# Patient Record
Sex: Male | Born: 1940 | Race: White | Hispanic: No | Smoking: Former smoker
Health system: Southern US, Community
[De-identification: ages and names within clinical notes are randomized; demographics above are authoritative.]

## PROBLEM LIST (undated history)

## (undated) DIAGNOSIS — N400 Enlarged prostate without lower urinary tract symptoms: Secondary | ICD-10-CM

## (undated) DIAGNOSIS — C3490 Malignant neoplasm of unspecified part of unspecified bronchus or lung: Secondary | ICD-10-CM

## (undated) DIAGNOSIS — F0280 Dementia in other diseases classified elsewhere without behavioral disturbance: Secondary | ICD-10-CM

## (undated) DIAGNOSIS — I10 Essential (primary) hypertension: Secondary | ICD-10-CM

## (undated) DIAGNOSIS — I714 Abdominal aortic aneurysm, without rupture, unspecified: Secondary | ICD-10-CM

## (undated) DIAGNOSIS — F39 Unspecified mood [affective] disorder: Secondary | ICD-10-CM

## (undated) DIAGNOSIS — K219 Gastro-esophageal reflux disease without esophagitis: Secondary | ICD-10-CM

## (undated) DIAGNOSIS — F32A Depression, unspecified: Secondary | ICD-10-CM

## (undated) DIAGNOSIS — G309 Alzheimer's disease, unspecified: Secondary | ICD-10-CM

## (undated) DIAGNOSIS — I639 Cerebral infarction, unspecified: Secondary | ICD-10-CM

## (undated) DIAGNOSIS — F22 Delusional disorders: Secondary | ICD-10-CM

## (undated) DIAGNOSIS — J40 Bronchitis, not specified as acute or chronic: Secondary | ICD-10-CM

## (undated) DIAGNOSIS — E785 Hyperlipidemia, unspecified: Secondary | ICD-10-CM

## (undated) DIAGNOSIS — N19 Unspecified kidney failure: Secondary | ICD-10-CM

## (undated) DIAGNOSIS — J449 Chronic obstructive pulmonary disease, unspecified: Secondary | ICD-10-CM

## (undated) DIAGNOSIS — F329 Major depressive disorder, single episode, unspecified: Secondary | ICD-10-CM

## (undated) DIAGNOSIS — I2510 Atherosclerotic heart disease of native coronary artery without angina pectoris: Secondary | ICD-10-CM

## (undated) DIAGNOSIS — I509 Heart failure, unspecified: Secondary | ICD-10-CM

## (undated) DIAGNOSIS — R262 Difficulty in walking, not elsewhere classified: Secondary | ICD-10-CM

## (undated) DIAGNOSIS — F0151 Vascular dementia with behavioral disturbance: Secondary | ICD-10-CM

## (undated) DIAGNOSIS — F0152 Vascular dementia, unspecified severity, with psychotic disturbance: Secondary | ICD-10-CM

## (undated) DIAGNOSIS — M6281 Muscle weakness (generalized): Secondary | ICD-10-CM

## (undated) HISTORY — DX: Essential (primary) hypertension: I10

## (undated) HISTORY — DX: Benign prostatic hyperplasia without lower urinary tract symptoms: N40.0

## (undated) HISTORY — DX: Major depressive disorder, single episode, unspecified: F32.9

## (undated) HISTORY — PX: HERNIA REPAIR: SHX51

## (undated) HISTORY — DX: Unspecified kidney failure: N19

## (undated) HISTORY — PX: CHOLECYSTECTOMY: SHX55

## (undated) HISTORY — DX: Abdominal aortic aneurysm, without rupture: I71.4

## (undated) HISTORY — DX: Chronic obstructive pulmonary disease, unspecified: J44.9

## (undated) HISTORY — DX: Hyperlipidemia, unspecified: E78.5

## (undated) HISTORY — DX: Difficulty in walking, not elsewhere classified: R26.2

## (undated) HISTORY — DX: Abdominal aortic aneurysm, without rupture, unspecified: I71.40

## (undated) HISTORY — DX: Malignant neoplasm of unspecified part of unspecified bronchus or lung: C34.90

## (undated) HISTORY — DX: Unspecified mood (affective) disorder: F39

## (undated) HISTORY — DX: Heart failure, unspecified: I50.9

## (undated) HISTORY — PX: KNEE SURGERY: SHX244

## (undated) HISTORY — DX: Vascular dementia, unspecified severity, with psychotic disturbance: F01.52

## (undated) HISTORY — DX: Gastro-esophageal reflux disease without esophagitis: K21.9

## (undated) HISTORY — DX: Atherosclerotic heart disease of native coronary artery without angina pectoris: I25.10

## (undated) HISTORY — DX: Cerebral infarction, unspecified: I63.9

## (undated) HISTORY — DX: Depression, unspecified: F32.A

## (undated) HISTORY — DX: Bronchitis, not specified as acute or chronic: J40

## (undated) HISTORY — DX: Delusional disorders: F22

## (undated) HISTORY — DX: Muscle weakness (generalized): M62.81

## (undated) HISTORY — DX: Delusional disorders: F01.51

---

## 1996-01-20 ENCOUNTER — Encounter: Payer: Self-pay | Admitting: Internal Medicine

## 1996-01-20 HISTORY — PX: CORONARY ANGIOPLASTY WITH STENT PLACEMENT: SHX49

## 1996-06-28 ENCOUNTER — Encounter: Payer: Self-pay | Admitting: Cardiovascular Disease

## 1997-09-23 DIAGNOSIS — I639 Cerebral infarction, unspecified: Secondary | ICD-10-CM

## 1997-09-23 HISTORY — PX: CORONARY ARTERY BYPASS GRAFT: SHX141

## 1997-09-23 HISTORY — PX: OTHER SURGICAL HISTORY: SHX169

## 1997-09-23 HISTORY — DX: Cerebral infarction, unspecified: I63.9

## 1997-12-15 ENCOUNTER — Encounter: Payer: Self-pay | Admitting: Internal Medicine

## 1997-12-26 ENCOUNTER — Inpatient Hospital Stay (HOSPITAL_COMMUNITY): Admission: EM | Admit: 1997-12-26 | Discharge: 1997-12-31 | Payer: Self-pay | Admitting: Emergency Medicine

## 1997-12-26 ENCOUNTER — Encounter: Payer: Self-pay | Admitting: Internal Medicine

## 1997-12-31 ENCOUNTER — Inpatient Hospital Stay (HOSPITAL_COMMUNITY)
Admission: AD | Admit: 1997-12-31 | Discharge: 1998-01-20 | Payer: Self-pay | Admitting: Physical Medicine & Rehabilitation

## 1998-02-21 ENCOUNTER — Emergency Department (HOSPITAL_COMMUNITY): Admission: EM | Admit: 1998-02-21 | Discharge: 1998-02-21 | Payer: Self-pay | Admitting: Emergency Medicine

## 2000-12-06 ENCOUNTER — Emergency Department (HOSPITAL_COMMUNITY): Admission: EM | Admit: 2000-12-06 | Discharge: 2000-12-06 | Payer: Self-pay | Admitting: Emergency Medicine

## 2000-12-06 ENCOUNTER — Encounter: Payer: Self-pay | Admitting: Emergency Medicine

## 2002-06-01 ENCOUNTER — Encounter: Admission: RE | Admit: 2002-06-01 | Discharge: 2002-06-01 | Payer: Self-pay | Admitting: Emergency Medicine

## 2002-06-01 ENCOUNTER — Encounter: Payer: Self-pay | Admitting: Emergency Medicine

## 2002-06-19 ENCOUNTER — Encounter: Payer: Self-pay | Admitting: *Deleted

## 2002-06-19 ENCOUNTER — Encounter: Payer: Self-pay | Admitting: Emergency Medicine

## 2002-06-19 ENCOUNTER — Inpatient Hospital Stay (HOSPITAL_COMMUNITY): Admission: EM | Admit: 2002-06-19 | Discharge: 2002-06-24 | Payer: Self-pay | Admitting: *Deleted

## 2002-06-21 ENCOUNTER — Encounter: Payer: Self-pay | Admitting: Emergency Medicine

## 2002-06-22 ENCOUNTER — Encounter (INDEPENDENT_AMBULATORY_CARE_PROVIDER_SITE_OTHER): Payer: Self-pay | Admitting: Specialist

## 2005-05-31 ENCOUNTER — Encounter: Payer: Self-pay | Admitting: Internal Medicine

## 2005-05-31 ENCOUNTER — Ambulatory Visit: Payer: Self-pay | Admitting: Internal Medicine

## 2005-05-31 ENCOUNTER — Observation Stay (HOSPITAL_COMMUNITY): Admission: EM | Admit: 2005-05-31 | Discharge: 2005-05-31 | Payer: Self-pay | Admitting: Emergency Medicine

## 2005-06-19 ENCOUNTER — Ambulatory Visit (HOSPITAL_COMMUNITY): Admission: RE | Admit: 2005-06-19 | Discharge: 2005-06-19 | Payer: Self-pay | Admitting: Emergency Medicine

## 2005-06-19 ENCOUNTER — Encounter (INDEPENDENT_AMBULATORY_CARE_PROVIDER_SITE_OTHER): Payer: Self-pay | Admitting: Specialist

## 2005-06-25 ENCOUNTER — Ambulatory Visit: Payer: Self-pay | Admitting: Internal Medicine

## 2005-06-28 ENCOUNTER — Ambulatory Visit (HOSPITAL_COMMUNITY): Admission: RE | Admit: 2005-06-28 | Discharge: 2005-06-28 | Payer: Self-pay | Admitting: Internal Medicine

## 2005-07-08 ENCOUNTER — Ambulatory Visit: Payer: Self-pay | Admitting: Internal Medicine

## 2005-07-25 ENCOUNTER — Encounter (INDEPENDENT_AMBULATORY_CARE_PROVIDER_SITE_OTHER): Payer: Self-pay | Admitting: Specialist

## 2005-07-25 ENCOUNTER — Inpatient Hospital Stay (HOSPITAL_COMMUNITY)
Admission: RE | Admit: 2005-07-25 | Discharge: 2005-08-08 | Payer: Self-pay | Admitting: Thoracic Surgery (Cardiothoracic Vascular Surgery)

## 2005-09-23 HISTORY — PX: OTHER SURGICAL HISTORY: SHX169

## 2005-09-24 ENCOUNTER — Encounter
Admission: RE | Admit: 2005-09-24 | Discharge: 2005-09-24 | Payer: Self-pay | Admitting: Thoracic Surgery (Cardiothoracic Vascular Surgery)

## 2005-12-30 ENCOUNTER — Encounter
Admission: RE | Admit: 2005-12-30 | Discharge: 2005-12-30 | Payer: Self-pay | Admitting: Thoracic Surgery (Cardiothoracic Vascular Surgery)

## 2006-04-18 ENCOUNTER — Encounter
Admission: RE | Admit: 2006-04-18 | Discharge: 2006-04-18 | Payer: Self-pay | Admitting: Thoracic Surgery (Cardiothoracic Vascular Surgery)

## 2006-07-22 ENCOUNTER — Encounter
Admission: RE | Admit: 2006-07-22 | Discharge: 2006-07-22 | Payer: Self-pay | Admitting: Thoracic Surgery (Cardiothoracic Vascular Surgery)

## 2006-09-23 DIAGNOSIS — C3490 Malignant neoplasm of unspecified part of unspecified bronchus or lung: Secondary | ICD-10-CM

## 2006-09-23 HISTORY — DX: Malignant neoplasm of unspecified part of unspecified bronchus or lung: C34.90

## 2007-05-27 ENCOUNTER — Emergency Department (HOSPITAL_COMMUNITY): Admission: EM | Admit: 2007-05-27 | Discharge: 2007-05-27 | Payer: Self-pay | Admitting: *Deleted

## 2008-07-05 ENCOUNTER — Emergency Department (HOSPITAL_COMMUNITY): Admission: EM | Admit: 2008-07-05 | Discharge: 2008-07-05 | Payer: Self-pay | Admitting: Emergency Medicine

## 2008-10-05 ENCOUNTER — Inpatient Hospital Stay (HOSPITAL_COMMUNITY): Admission: EM | Admit: 2008-10-05 | Discharge: 2008-10-07 | Payer: Self-pay | Admitting: Emergency Medicine

## 2008-10-05 ENCOUNTER — Ambulatory Visit: Payer: Self-pay | Admitting: Cardiology

## 2008-10-06 ENCOUNTER — Encounter (INDEPENDENT_AMBULATORY_CARE_PROVIDER_SITE_OTHER): Payer: Self-pay | Admitting: Internal Medicine

## 2008-10-06 ENCOUNTER — Ambulatory Visit: Payer: Self-pay | Admitting: Surgery

## 2008-10-06 ENCOUNTER — Encounter: Payer: Self-pay | Admitting: Cardiovascular Disease

## 2008-12-16 ENCOUNTER — Encounter: Payer: Self-pay | Admitting: Internal Medicine

## 2008-12-16 DIAGNOSIS — Z8679 Personal history of other diseases of the circulatory system: Secondary | ICD-10-CM | POA: Insufficient documentation

## 2008-12-16 DIAGNOSIS — I10 Essential (primary) hypertension: Secondary | ICD-10-CM | POA: Insufficient documentation

## 2008-12-16 DIAGNOSIS — R269 Unspecified abnormalities of gait and mobility: Secondary | ICD-10-CM

## 2008-12-16 DIAGNOSIS — I2510 Atherosclerotic heart disease of native coronary artery without angina pectoris: Secondary | ICD-10-CM

## 2008-12-16 DIAGNOSIS — K219 Gastro-esophageal reflux disease without esophagitis: Secondary | ICD-10-CM

## 2008-12-16 DIAGNOSIS — J40 Bronchitis, not specified as acute or chronic: Secondary | ICD-10-CM | POA: Insufficient documentation

## 2009-01-16 ENCOUNTER — Emergency Department (HOSPITAL_COMMUNITY): Admission: EM | Admit: 2009-01-16 | Discharge: 2009-01-16 | Payer: Self-pay | Admitting: Emergency Medicine

## 2009-01-16 ENCOUNTER — Encounter (INDEPENDENT_AMBULATORY_CARE_PROVIDER_SITE_OTHER): Payer: Self-pay | Admitting: Emergency Medicine

## 2009-01-16 ENCOUNTER — Ambulatory Visit: Payer: Self-pay | Admitting: Vascular Surgery

## 2009-02-14 ENCOUNTER — Ambulatory Visit: Payer: Self-pay | Admitting: Internal Medicine

## 2009-02-14 DIAGNOSIS — E785 Hyperlipidemia, unspecified: Secondary | ICD-10-CM

## 2009-02-14 DIAGNOSIS — F39 Unspecified mood [affective] disorder: Secondary | ICD-10-CM | POA: Insufficient documentation

## 2009-02-14 DIAGNOSIS — Z8669 Personal history of other diseases of the nervous system and sense organs: Secondary | ICD-10-CM

## 2009-02-15 DIAGNOSIS — I5032 Chronic diastolic (congestive) heart failure: Secondary | ICD-10-CM

## 2009-02-28 ENCOUNTER — Ambulatory Visit: Payer: Self-pay | Admitting: Internal Medicine

## 2009-03-21 DIAGNOSIS — I219 Acute myocardial infarction, unspecified: Secondary | ICD-10-CM | POA: Insufficient documentation

## 2009-03-21 DIAGNOSIS — C3490 Malignant neoplasm of unspecified part of unspecified bronchus or lung: Secondary | ICD-10-CM | POA: Insufficient documentation

## 2009-03-21 DIAGNOSIS — R0602 Shortness of breath: Secondary | ICD-10-CM

## 2009-03-21 DIAGNOSIS — I6350 Cerebral infarction due to unspecified occlusion or stenosis of unspecified cerebral artery: Secondary | ICD-10-CM | POA: Insufficient documentation

## 2009-03-22 ENCOUNTER — Ambulatory Visit: Payer: Self-pay | Admitting: Cardiology

## 2009-03-23 ENCOUNTER — Telehealth: Payer: Self-pay | Admitting: Cardiology

## 2009-04-03 ENCOUNTER — Telehealth (INDEPENDENT_AMBULATORY_CARE_PROVIDER_SITE_OTHER): Payer: Self-pay | Admitting: *Deleted

## 2009-04-04 ENCOUNTER — Encounter: Payer: Self-pay | Admitting: Cardiology

## 2009-04-04 ENCOUNTER — Ambulatory Visit: Payer: Self-pay

## 2009-04-04 ENCOUNTER — Encounter: Payer: Self-pay | Admitting: Cardiovascular Disease

## 2009-05-02 ENCOUNTER — Ambulatory Visit: Payer: Self-pay | Admitting: Internal Medicine

## 2009-05-02 LAB — CONVERTED CEMR LAB
Glucose, Urine, Semiquant: NEGATIVE
Ketones, urine, test strip: NEGATIVE
Protein, U semiquant: NEGATIVE
Specific Gravity, Urine: 1.01
Urobilinogen, UA: 0.2
WBC Urine, dipstick: NEGATIVE
pH: 5

## 2009-05-11 ENCOUNTER — Ambulatory Visit: Payer: Self-pay | Admitting: Cardiology

## 2009-05-11 DIAGNOSIS — I714 Abdominal aortic aneurysm, without rupture, unspecified: Secondary | ICD-10-CM | POA: Insufficient documentation

## 2009-05-25 ENCOUNTER — Encounter: Payer: Self-pay | Admitting: Cardiology

## 2009-05-25 ENCOUNTER — Ambulatory Visit: Payer: Self-pay

## 2009-07-15 ENCOUNTER — Emergency Department (HOSPITAL_COMMUNITY): Admission: EM | Admit: 2009-07-15 | Discharge: 2009-07-15 | Payer: Self-pay | Admitting: Emergency Medicine

## 2009-07-15 ENCOUNTER — Emergency Department (HOSPITAL_COMMUNITY): Admission: EM | Admit: 2009-07-15 | Discharge: 2009-07-15 | Payer: Self-pay | Admitting: Family Medicine

## 2009-07-15 LAB — CONVERTED CEMR LAB
HCT: 38.6 %
Hemoglobin: 13 g/dL
Monocytes Relative: 11 %
RBC: 4.18 M/uL
WBC: 5.1 10*3/uL

## 2009-07-24 ENCOUNTER — Ambulatory Visit: Payer: Self-pay | Admitting: Internal Medicine

## 2009-10-13 ENCOUNTER — Ambulatory Visit: Payer: Self-pay | Admitting: Internal Medicine

## 2009-10-13 DIAGNOSIS — M545 Low back pain: Secondary | ICD-10-CM

## 2009-10-13 LAB — CONVERTED CEMR LAB
AST: 24 units/L (ref 0–37)
Basophils Absolute: 0 10*3/uL (ref 0.0–0.1)
Basophils Relative: 0.4 % (ref 0.0–3.0)
CO2: 27 meq/L (ref 19–32)
Chloride: 105 meq/L (ref 96–112)
Cholesterol: 116 mg/dL (ref 0–200)
Eosinophils Relative: 1.4 % (ref 0.0–5.0)
GFR calc non Af Amer: 63.48 mL/min (ref >60)
Glucose, Bld: 88 mg/dL (ref 70–99)
Lymphocytes Relative: 29 % (ref 12.0–46.0)
Lymphs Abs: 1.7 10*3/uL (ref 0.7–4.0)
Monocytes Relative: 10.3 % (ref 3.0–12.0)
Neutro Abs: 3.5 10*3/uL (ref 1.4–7.7)
Neutrophils Relative %: 58.9 % (ref 43.0–77.0)
Platelets: 217 10*3/uL (ref 150.0–400.0)
RBC: 4.4 M/uL (ref 4.22–5.81)
Sodium: 138 meq/L (ref 135–145)
VLDL: 17 mg/dL (ref 0.0–40.0)
WBC: 5.9 10*3/uL (ref 4.5–10.5)

## 2009-10-17 ENCOUNTER — Telehealth (INDEPENDENT_AMBULATORY_CARE_PROVIDER_SITE_OTHER): Payer: Self-pay | Admitting: *Deleted

## 2009-10-23 ENCOUNTER — Ambulatory Visit: Payer: Self-pay | Admitting: Cardiology

## 2009-10-26 ENCOUNTER — Encounter: Payer: Self-pay | Admitting: Cardiology

## 2009-10-26 ENCOUNTER — Ambulatory Visit: Payer: Self-pay | Admitting: Cardiology

## 2009-10-26 DIAGNOSIS — I679 Cerebrovascular disease, unspecified: Secondary | ICD-10-CM

## 2009-10-30 ENCOUNTER — Encounter: Payer: Self-pay | Admitting: Internal Medicine

## 2009-10-30 ENCOUNTER — Ambulatory Visit: Payer: Self-pay | Admitting: Thoracic Surgery (Cardiothoracic Vascular Surgery)

## 2009-11-06 ENCOUNTER — Encounter: Payer: Self-pay | Admitting: Internal Medicine

## 2010-02-16 ENCOUNTER — Ambulatory Visit: Payer: Self-pay | Admitting: Internal Medicine

## 2010-02-16 DIAGNOSIS — N401 Enlarged prostate with lower urinary tract symptoms: Secondary | ICD-10-CM | POA: Insufficient documentation

## 2010-04-11 ENCOUNTER — Ambulatory Visit: Payer: Self-pay | Admitting: Internal Medicine

## 2010-04-13 ENCOUNTER — Telehealth: Payer: Self-pay | Admitting: Internal Medicine

## 2010-06-20 ENCOUNTER — Encounter: Payer: Self-pay | Admitting: Internal Medicine

## 2010-06-21 ENCOUNTER — Ambulatory Visit: Payer: Self-pay | Admitting: Internal Medicine

## 2010-06-21 DIAGNOSIS — H9190 Unspecified hearing loss, unspecified ear: Secondary | ICD-10-CM | POA: Insufficient documentation

## 2010-06-21 DIAGNOSIS — F0151 Vascular dementia with behavioral disturbance: Secondary | ICD-10-CM

## 2010-06-21 DIAGNOSIS — F22 Delusional disorders: Secondary | ICD-10-CM

## 2010-07-12 ENCOUNTER — Ambulatory Visit: Payer: Self-pay | Admitting: Psychology

## 2010-07-31 ENCOUNTER — Encounter: Payer: Self-pay | Admitting: Internal Medicine

## 2010-08-02 ENCOUNTER — Ambulatory Visit: Payer: Self-pay | Admitting: Internal Medicine

## 2010-08-02 LAB — CONVERTED CEMR LAB
AST: 23 units/L (ref 0–37)
Alkaline Phosphatase: 53 units/L (ref 39–117)
Bilirubin, Direct: 0.1 mg/dL (ref 0.0–0.3)
Total Bilirubin: 0.7 mg/dL (ref 0.3–1.2)

## 2010-10-08 ENCOUNTER — Encounter: Payer: Self-pay | Admitting: Internal Medicine

## 2010-10-14 ENCOUNTER — Encounter: Payer: Self-pay | Admitting: Thoracic Surgery (Cardiothoracic Vascular Surgery)

## 2010-10-17 ENCOUNTER — Telehealth: Payer: Self-pay | Admitting: Internal Medicine

## 2010-10-22 ENCOUNTER — Emergency Department (HOSPITAL_COMMUNITY)
Admission: EM | Admit: 2010-10-22 | Discharge: 2010-10-22 | Payer: Self-pay | Source: Home / Self Care | Admitting: Emergency Medicine

## 2010-10-22 ENCOUNTER — Encounter: Payer: Self-pay | Admitting: Internal Medicine

## 2010-10-23 NOTE — Assessment & Plan Note (Signed)
Summary: MIND PROBLEM PER  DAU/LESA--STC   Vital Signs:  Patient profile:   70 year old male Height:      67 inches (170.18 cm) Weight:      145.0 pounds (65.91 kg) O2 Sat:      95 % on Room air Temp:     98.3 degrees F (36.83 degrees C) oral Pulse rate:   59 / minute BP sitting:   110 / 52  (left arm) Cuff size:   regular  Vitals Entered By: Orlan Leavens (April 11, 2010 1:13 PM)  O2 Flow:  Room air CC: Memory problem Is Patient Diabetic? No Pain Assessment Patient in pain? no        Primary Care Provider:  Newt Lukes MD  CC:  Memory problem.  History of Present Illness: here for ?memory problems - remeron caused "hype" mood - inc aggitation  also reviewed other med issues -  1) htn - reports compliance with ongoing medical treatment and no changes in medication dose or frequency. denies adverse side effects related to current therapy.  no cp or dizziness  2) mood do - reports compliance with ongoing medical treatment and no changes in medication dose or frequency. denies adverse side effects related to current therapy. remains irritable per family, loss of appetite and onging weight loss - "i'm ready for the lord to take me" but denies sadness.  3) dyslipidemia - reports compliance with ongoing medical treatment and no changes in medication dose or frequency. denies adverse side effects related to current therapy. no GI or muscle problems  4) c/o low back pain -  seen in er 06/2009 for same - no change in symptoms but dtr says pt less active than usual - pt attributes this to pain. no change in walking, no numbness or incontinece  5) hx lung ca surg - saw hendrickson againearly 2011 for same because of inc pain in inscional hernia (no n/v or inablilty to reduce) - no problems indetified on exam or CT  6) bph - hard to empty bladder - occ dribble symptoms - no hematuria or dysuria - never on med for same  Current Medications (verified): 1)  Nitroglycerin  0.4 Mg/hr Pt24 (Nitroglycerin) .... Take As Needed 2)  Aspirin Ec 325 Mg Tbec (Aspirin) .... Take One Tablet By Mouth Daily 3)  Lovastatin 40 Mg Tabs (Lovastatin) .... Take 1 Qhs 4)  Lisinopril 40 Mg Tabs (Lisinopril) .... Take 1 By Mouth Qd 5)  Nexium 40 Mg Cpdr (Esomeprazole Magnesium) .... Take 1 By Mouth Qd 6)  Paroxetine Hcl 20 Mg Tabs (Paroxetine Hcl) .Marland Kitchen.. 1 By Mouth Once Daily 7)  Amlodipine Besylate 10 Mg Tabs (Amlodipine Besylate) .Marland Kitchen.. 1 By Mouth Once Daily 8)  Hydralazine Hcl 50 Mg Tabs (Hydralazine Hcl) .... One Tablet By Mouth Four Times Daily 9)  Ultracet 37.5-325 Mg Tabs (Tramadol-Acetaminophen) .Marland Kitchen.. 1-2 By Mouth Every 6 Hours As Needed For Back Pain Symptoms 10)  Remeron 15 Mg Tabs (Mirtazapine) .Marland Kitchen.. 1 By Mouth At Bedtime 11)  Tamsulosin Hcl 0.4 Mg Caps (Tamsulosin Hcl) .Marland Kitchen.. 1 By Mouth Once Daily  Allergies (verified): 1)  ! Hydrochlorothiazide  Past History:  Past Medical History: HYPERLIPIDEMIA  CEREBROVASCULAR ACCIDENT, HX - residual LLE HP CORONARY ARTERY DISEASE s/p CABG HYPERTENSION LUNG CANCER s/p R lung rescetion CHF   SYNCOPE, HX OF  GAIT DISTURBANCE (ICD-781.2) COPD (ICD-496) GERD (ICD-530.81)  Review of Systems  The patient denies fever, chest pain, headaches, and abdominal pain.  Physical Exam  General:  alert, well-developed, well-nourished, and cooperative to examination.   dtr at side Lungs:  diminished right base but no wheeze crackle or inc WOB Heart:  normal rate, regular rhythm, no murmur, and no rub. BLE without edema. Psych:  expansive mood but aaox3, good eye contact, not anxious appearing, mildly depressed appearing, and not agitated.     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)  no evidence on MMSE for dementia - but very distractable continue Paxil to 20 mg -  stop remeron and start seroquel for psychotic behavior - titrate as needed - advised dtr to seek legal asst for "competency" and  gaurdianship issues (pt recently has restraining order placed against him by neighbor)  Orders: Prescription Created Electronically [id number])  Problem # 2:  CEREBROVASCULAR ACCIDENT, HX OF (ICD-V12.50)  chronic L HP - cont ASA tx and other risk factor mgmthandicap tag/plate applications forms signed today  Problem # 3:  CEREBROVASCULAR DISEASE (ICD-437.9)  Continue aspirin and statin. Followup carotid Doppler September 2012.  Complete Medication List: 1)  Nitroglycerin 0.4 Mg/hr Pt24 (Nitroglycerin) .... Take as needed 2)  Aspirin Ec 325 Mg Tbec (Aspirin) .... Take one tablet by mouth daily 3)  Lovastatin 40 Mg Tabs (Lovastatin) .... Take 1 qhs 4)  Lisinopril 40 Mg Tabs (Lisinopril) .... Take 1 by mouth qd 5)  Nexium 40 Mg Cpdr (Esomeprazole magnesium) .... Take 1 by mouth qd 6)  Paroxetine Hcl 20 Mg Tabs (Paroxetine hcl) .Marland Kitchen.. 1 by mouth once daily 7)  Amlodipine Besylate 10 Mg Tabs (Amlodipine besylate) .Marland Kitchen.. 1 by mouth once daily 8)  Hydralazine Hcl 50 Mg Tabs (Hydralazine hcl) .... One tablet by mouth four times daily 9)  Ultracet 37.5-325 Mg Tabs (Tramadol-acetaminophen) .Marland Kitchen.. 1-2 by mouth every 6 hours as needed for back pain symptoms 10)  Tamsulosin Hcl 0.4 Mg Caps (Tamsulosin hcl) .Marland Kitchen.. 1 by mouth once daily 11)  Seroquel 100 Mg Tabs (Quetiapine fumarate) .Marland Kitchen.. 1 by mouth at bedtime  Patient Instructions: 1)  it was good to see you today. 2)  stop remeron 3)  start seroquel - take at bedtime to help "settle down" mood -your prescription has been electronically submitted to your pharmacy. Please take as directed. Contact our office if you believe you're having problems with the medication(s).  4)  discuss with laywer about compentency issues - 5)  Please schedule a follow-up appointment in 6 weeks to review mood and medicines, call sooner if problems.  Prescriptions: SEROQUEL 100 MG TABS (QUETIAPINE FUMARATE) 1 by mouth at bedtime  #30 x 3   Entered and Authorized by:   Newt Lukes MD   Signed by:   Newt Lukes MD on 04/11/2010   Method used:   Electronically to        [facility] [address] [phone]* (retail)       [address]       [address]       [place], [place]  [address]       Ph: [phone]       Fax: [phone]   RxID:   [id number]

## 2010-10-23 NOTE — Letter (Signed)
Summary: Letter regarding pt's behavior/Patient's family  Letter regarding pt's behavior/Patient's family   Imported By: Sherian Rein 06/25/2010 11:42:48  _____________________________________________________________________  External Attachment:    Type:   Image     Comment:   External Document

## 2010-10-23 NOTE — Progress Notes (Signed)
Summary: ALT med  Phone Note Call from Patient Call back at Home Phone [phone]   Caller: Daughter Misty Stanley Summary of Call: Pt's daughter called stating that she was advised by pharmacist that "heart patients should not take Seroquel because it can cause death". Daughter is concerned about this because pt has CAD, CHF and has had a CA and does not want to give pt medications. Pt's daughter is requesting alternate medication. Initial call taken by: Margaret Pyle, CMA,  April 13, 2010 10:07 AM  Follow-up for Phone Call        noted - though i do not believe the risk outweighs potential benefit - non the less, will change med - start depakote 250 three times a day to help calm pt's mood  - erx done - keep f/u as prev advised to monitor effects of tx and titrate as needed  Follow-up by: Newt Lukes MD,  April 13, 2010 12:31 PM  Additional Follow-up for Phone Call Additional follow up Details #1::        Pt's daughter advised via VM. Told to monitor pt sxs and/or improvements on new medication and to keep ROV Additional Follow-up by: Margaret Pyle, CMA,  April 13, 2010 3:37 PM    New/Updated Medications: DEPAKOTE 250 MG TBEC (DIVALPROEX SODIUM) 1 by mouth three times a day Prescriptions: DEPAKOTE 250 MG TBEC (DIVALPROEX SODIUM) 1 by mouth three times a day  #90 x 2   Entered and Authorized by:   Newt Lukes MD   Signed by:   Newt Lukes MD on 04/13/2010   Method used:   Electronically to        [facility] [address] [phone]* (retail)       [address]       [address]       [place], [place]  [address]       Ph: [phone]       Fax: [phone]   RxID:   [id number]

## 2010-10-23 NOTE — Progress Notes (Signed)
Summary: CT chest needed for CTS eval  ---- Converted from flag ---- ---- 10/17/2009 3:20 PM, Newt Lukes MD wrote: please check the hospital record for ?CT done 06/2009  ---- 10/17/2009 3:16 PM, Margaret Pyle, CMA wrote: Revonda Standard from [facility] called to request copy of documentation in the form of a CT pt had done 06/2009. I cannot find a CT report. Can you send info to [employer]....call back [phone]. thanks ------------------------------  Phone Note Call from Patient      Additional Follow-up for Phone Call Additional follow up Details #2::    check hosp records only a chest (2) view was done back on 07/15/09. Pt did have a CT angio-chest done back in 01/16/09 Follow-up by: Orlan Leavens,  October 17, 2009 3:25 PM  Additional Follow-up for Phone Call Additional follow up Details #3:: Details for Additional Follow-up Action Taken: Recieved call from [facility] with Dr. Denyce Robert she states that pt will have to have a CT/Chest before he can see dr. Denyce Robert. Additional Follow-up by: Orlan Leavens,  October 17, 2009 3:35 PM   order placed labs just done this month 10/13/09 - BUN 15, Cr 1.2 -  thanks Newt Lukes MD  October 17, 2009 4:00 PM

## 2010-10-23 NOTE — Assessment & Plan Note (Signed)
Summary: 6 WK ROV /NWS   Vital Signs:  Patient profile:   70 year old male Height:      67 inches (170.18 cm) Weight:      147.12 pounds (66.87 kg) BMI:     23.13 O2 Sat:      96 % on Room air Temp:     97.7 degrees F (36.50 degrees C) oral Pulse rate:   65 / minute BP sitting:   102 / 52  (right arm) Cuff size:   regular  Vitals Entered By: Orlan Leavens RMA (August 02, 2010 8:56 AM)  O2 Flow:  Room air CC: 6 week follow-up Is Patient Diabetic? No Pain Assessment Patient in pain? no      Comments Req refills on meds   Primary Care Provider:  Newt Lukes MD  CC:  6 week follow-up.  History of Present Illness: here for followup:  dementia on remeron and depakote - less eratic behavior - dtr's letter stating concerns reviewed and scanned into EMR today MMSE done last OV - suspected dementia completed cog eval 07/12/10 with [facility] - ?results  htn - reports compliance with ongoing medical treatment and no changes in medication dose or frequency. denies adverse side effects related to current therapy.  no cp or dizziness  mood do - reports compliance with ongoing medical treatment and no changes in medication dose or frequency. denies adverse side effects related to current therapy. less irritable per family, no loss of appetite and less onging weight loss - "i'm ready for the lord to take me" but denies sadness.  dyslipidemia - reports compliance with ongoing medical treatment and no changes in medication dose or frequency. denies adverse side effects related to current therapy. no GI or muscle problems  hx lung ca surg - saw hendrickson again early 2011 for same because of inc pain in inscional hernia (no n/v or inablilty to reduce) - no problems indetified on exam or CT  bph - hard to empty bladder - occ dribble symptoms - no hematuria or dysuria - never on med for same  Clinical Review Panels:  Lipid Management   Cholesterol:  116 (10/13/2009)   LDL  (bad choesterol):  61 (10/13/2009)   HDL (good cholesterol):  38.30 (10/13/2009)  CBC   WBC:  5.9 (10/13/2009)   RBC:  4.40 (10/13/2009)   Hgb:  13.3 (10/13/2009)   Hct:  39.9 (10/13/2009)   Platelets:  217.0 (10/13/2009)   MCV  90.7 (10/13/2009)   MCHC  33.3 (10/13/2009)   RDW  13.1 (10/13/2009)   PMN:  58.9 (10/13/2009)   Lymphs:  29.0 (10/13/2009)   Monos:  10.3 (10/13/2009)   Eosinophils:  1.4 (10/13/2009)   Basophil:  0.4 (10/13/2009)  Complete Metabolic Panel   Glucose:  88 (10/13/2009)   Sodium:  138 (10/13/2009)   Potassium:  4.3 (10/13/2009)   Chloride:  105 (10/13/2009)   CO2:  27 (10/13/2009)   BUN:  15 (10/13/2009)   Creatinine:  1.2 (10/13/2009)   Albumin:  4.0 (10/13/2009)   Total Protein:  6.4 (10/13/2009)   Calcium:  9.1 (10/13/2009)   Total Bili:  0.8 (10/13/2009)   Alk Phos:  65 (10/13/2009)   SGPT (ALT):  23 (10/13/2009)   SGOT (AST):  24 (10/13/2009)   Current Medications (verified): 1)  Nitroglycerin 0.4 Mg/hr Pt24 (Nitroglycerin) .... Take As Needed 2)  Aspirin Ec 325 Mg Tbec (Aspirin) .... Take One Tablet By Mouth Daily 3)  Lovastatin 40 Mg Tabs (  Lovastatin) .... Take 1 Qhs 4)  Lisinopril 40 Mg Tabs (Lisinopril) .... Take 1 By Mouth Qd 5)  Nexium 40 Mg Cpdr (Esomeprazole Magnesium) .... Take 1 By Mouth Qd 6)  Paroxetine Hcl 20 Mg Tabs (Paroxetine Hcl) .Marland Kitchen.. 1 By Mouth Once Daily 7)  Amlodipine Besylate 10 Mg Tabs (Amlodipine Besylate) .Marland Kitchen.. 1 By Mouth Once Daily 8)  Hydralazine Hcl 50 Mg Tabs (Hydralazine Hcl) .... One Tablet By Mouth Four Times Daily 9)  Ultracet 37.5-325 Mg Tabs (Tramadol-Acetaminophen) .Marland Kitchen.. 1-2 By Mouth Every 6 Hours As Needed For Back Pain Symptoms 10)  Tamsulosin Hcl 0.4 Mg Caps (Tamsulosin Hcl) .Marland Kitchen.. 1 By Mouth Once Daily 11)  Depakote 250 Mg Tbec (Divalproex Sodium) .Marland Kitchen.. 1 By Mouth Three Times A Day 12)  Aricept 5 Mg Tabs (Donepezil Hcl) .Marland Kitchen.. 1 By Mouth Once Daily 13)  Remeron 15 Mg Tabs (Mirtazapine) .Marland Kitchen.. 1 By Mouth At  Bedtime  Allergies (verified): 1)  ! Hydrochlorothiazide  Past History:  Past Medical History: HYPERLIPIDEMIA  CEREBROVASCULAR ACCIDENT, HX - residual L HP LE>UE CORONARY ARTERY DISEASE s/p CABG HYPERTENSION LUNG CANCER s/p R lung rescetion CHF   SYNCOPE, HX OF  GAIT DISTURBANCE COPD  GERD    Review of Systems  The patient denies anorexia, chest pain, headaches, and abdominal pain.    Physical Exam  General:  alert, well-developed, well-nourished, and cooperative to examination.   dtr at side Lungs:  diminished right base but no wheeze crackle or inc WOB Heart:  normal rate, regular rhythm, no murmur, and no rub. BLE without edema. Neurologic:  alert & oriented X self and cranial nerves II-XII symetrically intact. gait cautious with cane due to mild LLE deficits (4/5); also LUE 4/5. speech fluent without dysarthria or aphasia; follows commands with good comprehension.  Psych:  calmer, less expansive mood, no pressured speech but cont tangential thoughts - alert but ox2, good eye contact, mildly anxious appearing, not depressed appearing, and less agitated with family.     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)  long hx mood disorder - now complicating behavior issues with driving, financial spending -  now seems to be responding to med tx for depression and mood stabilizers -  cont aricept and other mood meds s/p formal cognitive testing by behav health - will review results i prev rec to dtr to discuss with DMV and legal counsel re: obtaining hc and financial power of attorney i will also write letter to [facility] stating my medical opinion that pt should not drive (see attached note in EMR f/ dtr) - i informed pt of same  Problem # 2:  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 evicted 06/2010)  Problem # 3:  HYPERTENSION (ICD-401.9)  if continued weight loss, may need to reduce antiHTN regimen - no symptoms of hypotension reported His updated medication list for this problem includes:    Lisinopril 40 Mg Tabs (Lisinopril) .Marland Kitchen... Take 1 by mouth qd    Amlodipine Besylate 10 Mg Tabs (Amlodipine besylate) .Marland Kitchen... 1 by mouth once daily    Hydralazine Hcl 50 Mg Tabs (Hydralazine hcl) .Marland KitchenMarland KitchenMarland KitchenMarland Kitchen  One tablet by mouth four times daily  BP today: 102/52 Prior BP: 124/62 (06/21/2010)  Labs Reviewed: K+: 4.3 (10/13/2009) Creat: : 1.2 (10/13/2009)   Chol: 116 (10/13/2009)   HDL: 38.30 (10/13/2009)   LDL: 61 (10/13/2009)   TG: 85.0 (10/13/2009)  Problem # 4:  HYPERLIPIDEMIA (ICD-272.4)  His updated medication list for this problem includes:    Lovastatin 40 Mg Tabs (Lovastatin) .Marland Kitchen... Take 1 qhs  Labs Reviewed: SGOT: 24 (10/13/2009)   SGPT: 23 (10/13/2009)   HDL:38.30 (10/13/2009)  LDL:61 (10/13/2009)  Chol:116 (10/13/2009)  Trig:85.0 (10/13/2009)  Complete Medication List: 1)  Nitroglycerin 0.4 Mg/hr Pt24 (Nitroglycerin) .... Take as needed 2)  Aspirin Ec 325 Mg Tbec (Aspirin) .... Take one tablet by mouth daily 3)  Lovastatin 40 Mg Tabs (Lovastatin) .... Take 1 qhs 4)  Lisinopril 40 Mg Tabs (Lisinopril) .... Take 1 by mouth qd 5)  Nexium 40 Mg Cpdr (Esomeprazole magnesium) .... Take 1 by mouth qd 6)  Paroxetine Hcl 20 Mg Tabs (Paroxetine hcl) .Marland Kitchen.. 1 by mouth once daily 7)  Amlodipine Besylate 10 Mg Tabs (Amlodipine besylate) .Marland Kitchen.. 1 by mouth once daily 8)  Hydralazine Hcl 50 Mg Tabs (Hydralazine hcl) .... One tablet by mouth four times daily 9)  Ultracet 37.5-325 Mg Tabs (Tramadol-acetaminophen) .Marland Kitchen.. 1-2 by mouth every 6 hours as needed for back pain symptoms 10)   Tamsulosin Hcl 0.4 Mg Caps (Tamsulosin hcl) .Marland Kitchen.. 1 by mouth once daily 11)  Depakote 250 Mg Tbec (Divalproex sodium) .Marland Kitchen.. 1 by mouth three times a day 12)  Aricept 5 Mg Tabs (Donepezil hcl) .Marland Kitchen.. 1 by mouth once daily 13)  Remeron 15 Mg Tabs (Mirtazapine) .Marland Kitchen.. 1 by mouth at bedtime  Other Orders: TLB-Hepatic/Liver Function Pnl (80076-HEPATIC) T-Valproic Acid (Depakene) ([address])  Patient Instructions: 1)  it was good to see you today. 2)  medications reviewed today - no changes - refills done 3)  I will write letter to [facility] stating my opinion that you should not drive 4)  test(s) ordered today - your results will be posted on the phone tree for review in 48-72 hours from the time of test completion; call [phone] and enter your 9 digit MRN (listed above on this page, just below your name); if any changes need to be made or there are abnormal results, you will be contacted directly.  5)  Please schedule a follow-up appointment in 3 months to review mood and medicines, call sooner if problems.  will check choletserol at that time Prescriptions: REMERON 15 MG TABS (MIRTAZAPINE) 1 by mouth at bedtime  #30 x 5   Entered by:   Orlan Leavens RMA   Authorized by:   Newt Lukes MD   Signed by:   Orlan Leavens RMA on 08/02/2010   Method used:   Electronically to        [facility] [address] [phone]* (retail)       [address]       [address]       [place], [place]  [address]       Ph: [phone]       Fax: [phone]   RxID:   [id number] ARICEPT 5 MG TABS (DONEPEZIL HCL) 1 by mouth once daily  #30 x 5   Entered by:   Orlan Leavens RMA   Authorized by:   Newt Lukes MD   Signed by:   Orlan Leavens RMA on 08/02/2010   Method used:   Electronically to  [facility] [address] [phone]* (retail)       [address]       [address]       [place], [place]  [address]       Ph: [phone]       Fax: [phone]   RxID:   [id number] TAMSULOSIN HCL 0.4 MG CAPS  (TAMSULOSIN HCL) 1 by mouth once daily  #30 x 5   Entered by:   Orlan Leavens RMA   Authorized by:   Newt Lukes MD   Signed by:   Orlan Leavens RMA on 08/02/2010   Method used:   Electronically to        [facility] [address] [phone]* (retail)       [address]       [address]       [place], [place]  [address]       Ph: [phone]       Fax: [phone]   RxID:   [id number] AMLODIPINE BESYLATE 10 MG TABS (AMLODIPINE BESYLATE) 1 by mouth once daily  #90 x 1   Entered by:   Orlan Leavens RMA   Authorized by:   Newt Lukes MD   Signed by:   Orlan Leavens RMA on 08/02/2010   Method used:   Electronically to        [facility] [address] [phone]* (retail)       [address]       [address]       [place], [place]  [address]       Ph: [phone]       Fax: [phone]   RxID:   [id number] PAROXETINE HCL 20 MG TABS (PAROXETINE HCL) 1 by mouth once daily  #90 x 1   Entered by:   Orlan Leavens RMA   Authorized by:   Newt Lukes MD   Signed by:   Orlan Leavens RMA on 08/02/2010   Method used:   Electronically to        [facility] [address] [phone]* (retail)       [address]       [address]       [place], [place]  [address]       Ph: [phone]       Fax: [phone]   RxID:   [id number] NEXIUM 40 MG CPDR (ESOMEPRAZOLE MAGNESIUM) take 1 by mouth qd  #90 Each x 1   Entered by:   Orlan Leavens RMA   Authorized by:   Newt Lukes MD   Signed by:   Orlan Leavens RMA on 08/02/2010   Method used:   Electronically to        [facility] [address] [phone]* (retail)       [address]       [address]       [place], [place]  [address]       Ph: [phone]       Fax: [phone]   RxID:   [id number] LISINOPRIL 40 MG TABS (LISINOPRIL) take 1 by mouth qd  #90 x 1   Entered by:   Orlan Leavens RMA   Authorized by:   Newt Lukes MD   Signed by:   Orlan Leavens RMA on 08/02/2010   Method used:   Electronically to        [facility] [address]  [phone]* (retail)       [address]  [address]       [place], [place]  [address]       Ph: [phone]       Fax: [phone]   RxID:   [id number] LOVASTATIN 40 MG TABS (LOVASTATIN) take 1 qhs  #90 x 1   Entered by:   Orlan Leavens RMA   Authorized by:   Newt Lukes MD   Signed by:   Orlan Leavens RMA on 08/02/2010   Method used:   Electronically to        [facility] [address] [phone]* (retail)       [address]       [address]       [place], [place]  [address]       Ph: [phone]       Fax: [phone]   RxID:   [id number]    Orders Added: 1)  TLB-Hepatic/Liver Function Pnl [80076-HEPATIC] 2)  Est. Patient Level IV [[address]] 3)  T-Valproic Acid (Depakene) [[address]]
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
## 2010-10-23 NOTE — Letter (Signed)
Summary: Letter regarding driving issues/Patient  Letter regarding driving issues/Patient   Imported By: Sherian Rein 08/04/2010 08:30:28  _____________________________________________________________________  External Attachment:    Type:   Image     Comment:   External Document

## 2010-10-23 NOTE — Letter (Signed)
Summary: MMSE  MMSE   Imported By: Sherian Rein 06/25/2010 11:36:43  _____________________________________________________________________  External Attachment:    Type:   Image     Comment:   External Document
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
## 2010-10-23 NOTE — Assessment & Plan Note (Signed)
Summary: 4-6 MTH FU  STC   Vital Signs:  Patient profile:   70 year old male Height:      68 inches (172.72 cm) Weight:      137.0 pounds (62.27 kg) O2 Sat:      96 % on Room air Temp:     97.2 degrees F (36.22 degrees C) oral Pulse rate:   62 / minute BP sitting:   102 / 60  (left arm) Cuff size:   regular  Vitals Entered By: Orlan Leavens (Feb 16, 2010 9:05 AM)  O2 Flow:  Room air CC: 6 month follow-up/ Daughter states never recieved ultracet req refill Is Patient Diabetic? No   Primary Care Provider:  Newt Lukes MD  CC:  6 month follow-up/ Daughter states never recieved ultracet req refill.  History of Present Illness: here for f/u  1) htn - reports compliance with ongoing medical treatment and no changes in medication dose or frequency. denies adverse side effects related to current therapy.  no cp or dizziness  2) mood do - reports compliance with ongoing medical treatment and no changes in medication dose or frequency. denies adverse side effects related to current therapy. remains irritable per family, loss of appetite and onging weight loss - "i'm ready for the lord to take me" but denies sadness.  3) dyslipidemia - reports compliance with ongoing medical treatment and no changes in medication dose or frequency. denies adverse side effects related to current therapy. no GI or muscle problems  4) c/o low back pain -  seen in er 06/2009 for same - no change in symptoms but dtr says pt less active than usual - pt attributes this to pain. no change in walking, no numbness or incontinece  5) hx lung ca surg - saw hendrickson againearly 2011 for same because of inc pain in inscional hernia (no n/v or inablilty to reduce) - no problems indetified on exam or CT  6) bph - hard to empty bladder - occ dribble symptoms - no hematuria or dysuria - never on med for same  Clinical Review Panels:  Lipid Management   Cholesterol:  116 (10/13/2009)   LDL (bad  choesterol):  61 (10/13/2009)   HDL (good cholesterol):  38.30 (10/13/2009)  CBC   WBC:  5.9 (10/13/2009)   RBC:  4.40 (10/13/2009)   Hgb:  13.3 (10/13/2009)   Hct:  39.9 (10/13/2009)   Platelets:  217.0 (10/13/2009)   MCV  90.7 (10/13/2009)   MCHC  33.3 (10/13/2009)   RDW  13.1 (10/13/2009)   PMN:  58.9 (10/13/2009)   Lymphs:  29.0 (10/13/2009)   Monos:  10.3 (10/13/2009)   Eosinophils:  1.4 (10/13/2009)   Basophil:  0.4 (10/13/2009)  Complete Metabolic Panel   Glucose:  88 (10/13/2009)   Sodium:  138 (10/13/2009)   Potassium:  4.3 (10/13/2009)   Chloride:  105 (10/13/2009)   CO2:  27 (10/13/2009)   BUN:  15 (10/13/2009)   Creatinine:  1.2 (10/13/2009)   Albumin:  4.0 (10/13/2009)   Total Protein:  6.4 (10/13/2009)   Calcium:  9.1 (10/13/2009)   Total Bili:  0.8 (10/13/2009)   Alk Phos:  65 (10/13/2009)   SGPT (ALT):  23 (10/13/2009)   SGOT (AST):  24 (10/13/2009)   Current Medications (verified): 1)  Nitroglycerin 0.4 Mg/hr Pt24 (Nitroglycerin) .... Take As Needed 2)  Aspirin Ec 325 Mg Tbec (Aspirin) .... Take One Tablet By Mouth Daily 3)  Lovastatin 40 Mg Tabs (Lovastatin) .Marland KitchenMarland KitchenMarland Kitchen  Take 1 Qhs 4)  Lisinopril 40 Mg Tabs (Lisinopril) .... Take 1 By Mouth Qd 5)  Nexium 40 Mg Cpdr (Esomeprazole Magnesium) .... Take 1 By Mouth Qd 6)  Paroxetine Hcl 20 Mg Tabs (Paroxetine Hcl) .Marland Kitchen.. 1 By Mouth Once Daily 7)  Amlodipine Besylate 10 Mg Tabs (Amlodipine Besylate) .Marland Kitchen.. 1 By Mouth Once Daily 8)  Hydralazine Hcl 50 Mg Tabs (Hydralazine Hcl) .... One Tablet By Mouth Four Times Daily 9)  Ultracet 37.5-325 Mg Tabs (Tramadol-Acetaminophen) .Marland Kitchen.. 1-2 By Mouth Every 6 Hours As Needed For Back Pain Symptoms  Allergies (verified): 1)  ! Hydrochlorothiazide  Past History:  Past Medical History: HYPERLIPIDEMIA  CEREBROVASCULAR ACCIDENT, HX - residual LLE HP CORONARY ARTERY DISEASE s/p CABG HYPERTENSION LUNG CANCER s/p R lung rescetion CHF (ICD-428.0) SYNCOPE, HX OF (ICD-V12.49) GAIT  DISTURBANCE (ICD-781.2) COPD (ICD-496) GERD (ICD-530.81)  Review of Systems       The patient complains of weight loss.  The patient denies anorexia, fever, chest pain, syncope, dyspnea on exertion, peripheral edema, headaches, abdominal pain, and severe indigestion/heartburn.    Physical Exam  General:  alert, well-developed, well-nourished, and cooperative to examination.   dtr at side Lungs:  diminished right base but no wheeze crackle or inc WOB Heart:  normal rate, regular rhythm, no murmur, and no rub. BLE without edema. Neurologic:  alert & oriented X3 and cranial nerves II-XII symetrically intact. gait cautionus with cane due to mild LLE deficits (4/5). speech fluent without dysarthria or aphasia; follows commands with good comprehension.  Psych:  expansive mood but aaox3, good eye contact, not anxious appearing, mildly depressed appearing, and not agitated.     Impression & Recommendations:  Problem # 1:  HYPERTENSION (ICD-401.9) if continued weight loss, may need to reduce antiHTN regimen - no symptoms of hypotension reported His updated medication list for this problem includes:    Lisinopril 40 Mg Tabs (Lisinopril) .Marland Kitchen... Take 1 by mouth qd    Amlodipine Besylate 10 Mg Tabs (Amlodipine besylate) .Marland Kitchen... 1 by mouth once daily    Hydralazine Hcl 50 Mg Tabs (Hydralazine hcl) ..... One tablet by mouth four times daily  BP today: 102/60 Prior BP: 130/80 (10/26/2009)  Labs Reviewed: K+: 4.3 (10/13/2009) Creat: : 1.2 (10/13/2009)   Chol: 116 (10/13/2009)   HDL: 38.30 (10/13/2009)   LDL: 61 (10/13/2009)   TG: 85.0 (10/13/2009)  Problem # 2:  HYPERLIPIDEMIA (ICD-272.4)  His updated medication list for this problem includes:    Lovastatin 40 Mg Tabs (Lovastatin) .Marland Kitchen... Take 1 qhs  Labs Reviewed: SGOT: 24 (10/13/2009)   SGPT: 23 (10/13/2009)   HDL:38.30 (10/13/2009)  LDL:61 (10/13/2009)  Chol:116 (10/13/2009)  Trig:85.0 (10/13/2009)  Problem # 3:  MOOD DISORDER  (ICD-296.90)  no evidence on MMSE for dementia - but very distractable continue Paxil to 20 mg - and add remeron given weight loss  Orders: Prescription Created Electronically [phone])  Problem # 4:  HYPERTROPHY PROSTATE W/UR OBST & OTH LUTS (ICD-600.01)  add flomax trial His updated medication list for this problem includes:    Tamsulosin Hcl 0.4 Mg Caps (Tamsulosin hcl) .Marland Kitchen... 1 by mouth once daily  Orders: Prescription Created Electronically [phone])  Problem # 5:  BACK PAIN, LUMBAR, CHRONIC (ICD-724.2)  His updated medication list for this problem includes:    Aspirin Ec 325 Mg Tbec (Aspirin) .Marland Kitchen... Take one tablet by mouth daily    Ultracet 37.5-325 Mg Tabs (Tramadol-acetaminophen) .Marland Kitchen... 1-2 by mouth every 6 hours as needed for back pain symptoms  Complete Medication  List: 1)  Nitroglycerin 0.4 Mg/hr Pt24 (Nitroglycerin) .... Take as needed 2)  Aspirin Ec 325 Mg Tbec (Aspirin) .... Take one tablet by mouth daily 3)  Lovastatin 40 Mg Tabs (Lovastatin) .... Take 1 qhs 4)  Lisinopril 40 Mg Tabs (Lisinopril) .... Take 1 by mouth qd 5)  Nexium 40 Mg Cpdr (Esomeprazole magnesium) .... Take 1 by mouth qd 6)  Paroxetine Hcl 20 Mg Tabs (Paroxetine hcl) .Marland Kitchen.. 1 by mouth once daily 7)  Amlodipine Besylate 10 Mg Tabs (Amlodipine besylate) .Marland Kitchen.. 1 by mouth once daily 8)  Hydralazine Hcl 50 Mg Tabs (Hydralazine hcl) .... One tablet by mouth four times daily 9)  Ultracet 37.5-325 Mg Tabs (Tramadol-acetaminophen) .Marland Kitchen.. 1-2 by mouth every 6 hours as needed for back pain symptoms 10)  Remeron 15 Mg Tabs (Mirtazapine) .Marland Kitchen.. 1 by mouth at bedtime 11)  Tamsulosin Hcl 0.4 Mg Caps (Tamsulosin hcl) .Marland Kitchen.. 1 by mouth once daily  Patient Instructions: 1)  it was good to see you today.  2)  will start generic remeron for mood and appetite - take this in addition to paroxetine for irritability.  3)  will also start generic flomax for bladder and prostate symptoms  4)  ultracet for your back pain - 5)  your  prescriptions including refills have been electronically submitted to your pharmacy. Please take as directed. Contact our office if you believe you're having problems with the medication(s).   6)  Please schedule a follow-up appointment in 4-6 months, sooner if problems.  Prescriptions: ULTRACET 37.5-325 MG TABS (TRAMADOL-ACETAMINOPHEN) 1-2 by mouth every 6 hours as needed for back pain symptoms  #40 x 1   Entered and Authorized by:   Newt Lukes MD   Signed by:   Newt Lukes MD on 02/16/2010   Method used:   Electronically to        [facility] [address] [phone]* (retail)       [address]       [address]       [place], [place]  [address]       Ph: [phone]       Fax: [phone]   RxID:   [id number] HYDRALAZINE HCL 50 MG TABS (HYDRALAZINE HCL) one tablet by mouth four times daily  #120 x 12   Entered and Authorized by:   Newt Lukes MD   Signed by:   Newt Lukes MD on 02/16/2010   Method used:   Electronically to        [facility] [address] [phone]* (retail)       [address]       [address]       [place], [place]  [address]       Ph: [phone]       Fax: [phone]   RxID:   [id number] AMLODIPINE BESYLATE 10 MG TABS (AMLODIPINE BESYLATE) 1 by mouth once daily  #90 x 1   Entered and Authorized by:   Newt Lukes MD   Signed by:   Newt Lukes MD on 02/16/2010   Method used:   Electronically to        [facility] [address] [phone]* (retail)       [address]       [address]       [place], [place]  [address]       Ph: [phone]       Fax: [phone]   RxID:  [id number] PAROXETINE HCL 20 MG TABS (PAROXETINE HCL) 1 by mouth once daily  #90 x 1   Entered and Authorized by:   Newt Lukes MD   Signed by:   Newt Lukes MD on 02/16/2010   Method used:   Electronically to        [facility] [address] [phone]* (retail)       [address]       [address]       [place], [place]   [address]       Ph: [phone]       Fax: [phone]   RxID:   [id number] LISINOPRIL 40 MG TABS (LISINOPRIL) take 1 by mouth qd  #90 x 1   Entered and Authorized by:   Newt Lukes MD   Signed by:   Newt Lukes MD on 02/16/2010   Method used:   Electronically to        [facility] [address] [phone]* (retail)       [address]       [address]       [place], [place]  [address]       Ph: [phone]       Fax: [phone]   RxID:   [id number] LOVASTATIN 40 MG TABS (LOVASTATIN) take 1 qhs  #90 x 1   Entered and Authorized by:   Newt Lukes MD   Signed by:   Newt Lukes MD on 02/16/2010   Method used:   Electronically to        [facility] [address] [phone]* (retail)       [address]       [address]       [place], [place]  [address]       Ph: [phone]       Fax: [phone]   RxID:   [id number] TAMSULOSIN HCL 0.4 MG CAPS (TAMSULOSIN HCL) 1 by mouth once daily  #30 x 5   Entered and Authorized by:   Newt Lukes MD   Signed by:   Newt Lukes MD on 02/16/2010   Method used:   Electronically to        [facility] [address] [phone]* (retail)       [address]       [address]       [place], [place]  [address]       Ph: [phone]       Fax: [phone]   RxID:   [id number] REMERON 15 MG TABS (MIRTAZAPINE) 1 by mouth at bedtime  #30 x 5   Entered and Authorized by:   Newt Lukes MD   Signed by:   Newt Lukes MD on 02/16/2010   Method used:   Electronically to        [facility] [address] [phone]* (retail)       [address]       [address]       [place], [place]  [address]       Ph: [phone]       Fax: [phone]   RxID:   [id number]

## 2010-10-23 NOTE — Letter (Signed)
Summary: [facility]   Imported By: Lester [place] 11/22/2009 11:47:30  _____________________________________________________________________  External Attachment:    Type:   Image     Comment:   External Document

## 2010-10-23 NOTE — Letter (Signed)
Summary: Generic Letter  [facility]  [address] [place], [place] [address]   Phone: [phone]  Fax: [phone]    08/02/2010  Erik [name] [address] [place], [place]  [address]   Dear [name] and DMV,  It is my medical opinon that, due to your dementia and mood disorder, you should not be permitted to drive.  Your medical condition is a risk to your saftey as well as to others on the road. Therefore, as your personal physician, I feel you should not drive.  With this letter, I have informed both you and DMV of my concerns.       Sincerely,     Rene Paci MD   Fax DMV [phone]

## 2010-10-25 NOTE — Letter (Signed)
Summary: Letter RE patient's condition  Letter RE patient's condition   Imported By: Lester [facility] [place] 10/19/2010 07:46:40  _____________________________________________________________________  External Attachment:    Type:   Image     Comment:   External Document

## 2010-10-25 NOTE — Progress Notes (Signed)
Summary: letter from dtr - pt unsafe behavior and inc dementia  Phone Note Call from Patient   Caller: Letter from daughter/Lesian Zoll Reason for Call: Talk to Doctor Summary of Call: Daughter wrote letter to md very concern about father. Having some incontinence, refuses to take a bath, memory issues, unsafe behavior. Daughter is requesting  order for [facility] Guardianship form that was told to her by DSS. Initial call taken by: Orlan Leavens RMA,  October 17, 2010 10:07 AM  Follow-up for Phone Call        will order [facility] social work to assess the situation and help direct what needs to be done re: gaurdianship and ?FL2 - thx Follow-up by: Newt Lukes MD,  October 17, 2010 12:00 PM

## 2010-10-26 NOTE — Letter (Signed)
Summary: [facility]  [facility]   Imported By: Lester [place] 12/22/2009 11:25:29  _____________________________________________________________________  External Attachment:    Type:   Image     Comment:   External Document

## 2010-10-31 NOTE — Miscellaneous (Signed)
Summary: Social Worker/[facility]  Social [facility]   Imported By: Sherian Rein 10/25/2010 09:32:08  _____________________________________________________________________  External Attachment:    Type:   Image     Comment:   External Document

## 2010-11-01 ENCOUNTER — Other Ambulatory Visit: Payer: Self-pay | Admitting: Internal Medicine

## 2010-11-01 ENCOUNTER — Ambulatory Visit (INDEPENDENT_AMBULATORY_CARE_PROVIDER_SITE_OTHER): Payer: MEDICARE | Admitting: Internal Medicine

## 2010-11-01 ENCOUNTER — Other Ambulatory Visit: Payer: MEDICARE

## 2010-11-01 ENCOUNTER — Encounter: Payer: Self-pay | Admitting: Internal Medicine

## 2010-11-01 DIAGNOSIS — E785 Hyperlipidemia, unspecified: Secondary | ICD-10-CM

## 2010-11-01 DIAGNOSIS — F0151 Vascular dementia with behavioral disturbance: Secondary | ICD-10-CM

## 2010-11-01 LAB — LIPID PANEL
Cholesterol: 132 mg/dL (ref 0–200)
HDL: 37.9 mg/dL — ABNORMAL LOW (ref >39.00)
LDL Cholesterol: 67 mg/dL (ref 0–99)
Triglycerides: 137 mg/dL (ref 0.0–149.0)
VLDL: 27.4 mg/dL (ref 0.0–40.0)

## 2010-11-08 NOTE — Assessment & Plan Note (Signed)
Summary: 3 MTH STC   Vital Signs:  Patient profile:   70 year old male Height:      67 inches (170.18 cm) Weight:      151.2 pounds (68.73 kg) O2 Sat:      97 % on Room air Temp:     97.8 degrees F (36.56 degrees C) oral Pulse rate:   68 / minute BP sitting:   120 / 60  (right arm) Cuff size:   regular  Vitals Entered By: Orlan Leavens RMA (November 01, 2010 9:14 AM)  O2 Flow:  Room air CC: 3 month follow-up Is Patient Diabetic? No Pain Assessment Patient in pain? no      Comments Requesting handi-capp form to be fill-out   Primary Care Provider:  Newt Lukes MD  CC:  3 month follow-up.  History of Present Illness: here for followup:  dementia on remeron and depakote - fluctations in eratic behavior - dtr's letter stating concerns reviewed and scanned into EMR today unable to complete cog eval 07/12/10 with [facility] due to lack of participation with testing  htn - reports compliance with ongoing medical treatment and no changes in medication dose or frequency. denies adverse side effects related to current therapy.  no cp or dizziness  mood do - reports compliance with ongoing medical treatment and no changes in medication dose or frequency. denies adverse side effects related to current therapy. less irritable per family, no loss of appetite and less onging weight loss - "i'm ready for the lord to take me" but denies sadness.  dyslipidemia - reports compliance with ongoing medical treatment and no changes in medication dose or frequency. denies adverse side effects related to current therapy. no GI or muscle problems  hx lung ca surg - saw [facility]ndrickson again early 2011 for same because of inc pain in inscional hernia (no n/v or inablilty to reduce) - no problems indetified on exam or CT  bph - hard to empty bladder - occ dribble symptoms - no hematuria or dysuria - never on med for same  Clinical Review Panels:  Lipid Management   Cholesterol:  116  (10/13/2009)   LDL (bad choesterol):  61 (10/13/2009)   HDL (good cholesterol):  38.30 (10/13/2009)  CBC   WBC:  5.9 (10/13/2009)   RBC:  4.40 (10/13/2009)   Hgb:  13.3 (10/13/2009)   Hct:  39.9 (10/13/2009)   Platelets:  217.0 (10/13/2009)   MCV  90.7 (10/13/2009)   MCHC  33.3 (10/13/2009)   RDW  13.1 (10/13/2009)   PMN:  58.9 (10/13/2009)   Lymphs:  29.0 (10/13/2009)   Monos:  10.3 (10/13/2009)   Eosinophils:  1.4 (10/13/2009)   Basophil:  0.4 (10/13/2009)  Complete Metabolic Panel   Glucose:  88 (10/13/2009)   Sodium:  138 (10/13/2009)   Potassium:  4.3 (10/13/2009)   Chloride:  105 (10/13/2009)   CO2:  27 (10/13/2009)   BUN:  15 (10/13/2009)   Creatinine:  1.2 (10/13/2009)   Albumin:  3.5 (08/02/2010)   Total Protein:  5.9 (08/02/2010)   Calcium:  9.1 (10/13/2009)   Total Bili:  0.7 (08/02/2010)   Alk Phos:  53 (08/02/2010)   SGPT (ALT):  25 (08/02/2010)   SGOT (AST):  23 (08/02/2010)   Current Medications (verified): 1)  Nitroglycerin 0.4 Mg/hr Pt24 (Nitroglycerin) .... Take As Needed 2)  Aspirin Ec 325 Mg Tbec (Aspirin) .... Take One Tablet By Mouth Daily 3)  Lovastatin 40 Mg Tabs (Lovastatin) .... Take  1 Qhs 4)  Lisinopril 40 Mg Tabs (Lisinopril) .... Take 1 By Mouth Qd 5)  Nexium 40 Mg Cpdr (Esomeprazole Magnesium) .... Take 1 By Mouth Qd 6)  Paroxetine Hcl 20 Mg Tabs (Paroxetine Hcl) .Marland Kitchen.. 1 By Mouth Once Daily 7)  Amlodipine Besylate 10 Mg Tabs (Amlodipine Besylate) .Marland Kitchen.. 1 By Mouth Once Daily 8)  Hydralazine Hcl 50 Mg Tabs (Hydralazine Hcl) .... One Tablet By Mouth Four Times Daily 9)  Ultracet 37.5-325 Mg Tabs (Tramadol-Acetaminophen) .Marland Kitchen.. 1-2 By Mouth Every 6 Hours As Needed For Back Pain Symptoms 10)  Tamsulosin Hcl 0.4 Mg Caps (Tamsulosin Hcl) .Marland Kitchen.. 1 By Mouth Once Daily 11)  Depakote 250 Mg Tbec (Divalproex Sodium) .Marland Kitchen.. 1 By Mouth Three Times A Day 12)  Aricept 5 Mg Tabs (Donepezil Hcl) .Marland Kitchen.. 1 By Mouth Once Daily 13)  Remeron 15 Mg Tabs (Mirtazapine) .Marland Kitchen..  1 By Mouth At Bedtime  Allergies (verified): 1)  ! 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    Physical Exam  General:  alert, well-developed, well-nourished, and cooperative to examination.   dtr at side Lungs:  diminished right base but no wheeze crackle or inc WOB Heart:  normal rate, regular rhythm, no murmur, and no rub. BLE without edema. Psych:  calmer, less expansive mood, no pressured speech but cont tangential thoughts - alert but ox2, good eye contact, mildly anxious appearing, not depressed appearing, and less agitated with family.     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)  long hx mood disorder - now complicating behavior issues with driving, financial spending -  now seems to be responding to med tx for depression and mood stabilizers -  cont aricept and other mood meds s/p attempted formal cognitive testing by behav health - unable to complete due to lack of participate dtr working with SW re: legal gaurdianship for placement - i support this continue to work on legal counsel re: obtaining hc and financial power of attorney  Problem # 2:  HYPERLIPIDEMIA (ICD-272.4)  His updated medication list for this problem includes:    Lovastatin 40 Mg Tabs (Lovastatin) .Marland Kitchen... Take 1 qhs  Labs Reviewed: SGOT: 23 (08/02/2010)   SGPT: 25 (08/02/2010)   HDL:38.30 (10/13/2009)  LDL:61 (10/13/2009)  Chol:116 (10/13/2009)  Trig:85.0 (10/13/2009)  Orders: [facility]-Lipid Panel ([id number])  Complete Medication List: 1)  Nitroglycerin 0.4 Mg/hr Pt24 (Nitroglycerin) .... Take as needed 2)  Aspirin Ec 325 Mg Tbec  (Aspirin) .... Take one tablet by mouth daily 3)  Lovastatin 40 Mg Tabs (Lovastatin) .... Take 1 qhs 4)  Lisinopril 40 Mg Tabs (Lisinopril) .... Take 1 by mouth qd 5)  Nexium 40 Mg Cpdr (Esomeprazole magnesium) .... Take 1 by mouth qd 6)  Paroxetine Hcl 20 Mg Tabs (Paroxetine hcl) .Marland Kitchen.. 1 by mouth once daily 7)  Amlodipine Besylate 10 Mg Tabs (Amlodipine besylate) .Marland Kitchen.. 1 by mouth once daily 8)  Hydralazine Hcl 50 Mg Tabs (Hydralazine hcl) .... One tablet by mouth four times daily 9)  Ultracet 37.5-325 Mg Tabs (Tramadol-acetaminophen) .Marland Kitchen.. 1-2 by mouth every 6 hours as needed for back pain symptoms 10)  Tamsulosin Hcl 0.4 Mg Caps (Tamsulosin hcl) .Marland KitchenMarland KitchenMarland Kitchen  1 by mouth once daily 11)  Depakote 250 Mg Tbec (Divalproex sodium) .Marland Kitchen.. 1 by mouth three times a day 12)  Aricept 5 Mg Tabs (Donepezil hcl) .Marland Kitchen.. 1 by mouth once daily 13)  Remeron 15 Mg Tabs (Mirtazapine) .Marland Kitchen.. 1 by mouth at bedtime  Patient Instructions: 1)  it was good to see you today. 2)  medications reviewed today - no changes - refills as needed  3)  signed for handicap placard for your family to use while driving you places, but i still feel YOU should not drive 4)  test(s) ordered today - your results will be posted on the phone tree for review in 48-72 hours from the time of test completion; call [phone] and enter your 9 digit MRN (listed above on this page, just below your name); if any changes need to be made or there are abnormal results, you will be contacted directly.  5)  Please schedule a follow-up appointment in 3 months to review mood and medicines, call sooner if problems.     Orders Added: 1)  Est. Patient Level IV [63016] 2)  [facility]-Lipid Panel [[id number]]

## 2010-11-14 ENCOUNTER — Encounter: Payer: Self-pay | Admitting: Internal Medicine

## 2010-11-14 NOTE — Letter (Signed)
Summary: Handicapped Placard/[facility]  Handicapped Placard/[facility]   Imported By: Sherian Rein 11/05/2010 09:44:57  _____________________________________________________________________  External Attachment:    Type:   Image     Comment:   External Document

## 2010-11-29 NOTE — Miscellaneous (Signed)
Summary: Care Plans/[facility]  Care Plans/[facility]   Imported By: Sherian Rein 11/20/2010 12:44:18  _____________________________________________________________________  External Attachment:    Type:   Image     Comment:   External Document

## 2010-12-23 ENCOUNTER — Other Ambulatory Visit: Payer: Self-pay | Admitting: Internal Medicine

## 2010-12-27 LAB — DIFFERENTIAL
Eosinophils Absolute: 0.1 10*3/uL (ref 0.0–0.7)
Lymphocytes Relative: 29 % (ref 12–46)
Lymphs Abs: 1.5 10*3/uL (ref 0.7–4.0)
Monocytes Relative: 11 % (ref 3–12)
Neutrophils Relative %: 58 % (ref 43–77)

## 2010-12-27 LAB — POCT CARDIAC MARKERS
CKMB, poc: 1 ng/mL (ref 1.0–8.0)
Myoglobin, poc: 85.1 ng/mL (ref 12–200)

## 2010-12-27 LAB — BRAIN NATRIURETIC PEPTIDE: Pro B Natriuretic peptide (BNP): 138 pg/mL — ABNORMAL HIGH (ref 0.0–100.0)

## 2010-12-27 LAB — CBC
HCT: 38.6 % — ABNORMAL LOW (ref 39.0–52.0)
MCV: 92.4 fL (ref 78.0–100.0)
Platelets: 193 10*3/uL (ref 150–400)
RDW: 13.9 % (ref 11.5–15.5)

## 2011-01-02 LAB — DIFFERENTIAL
Eosinophils Absolute: 0.1 10*3/uL (ref 0.0–0.7)
Eosinophils Relative: 2 % (ref 0–5)
Lymphs Abs: 1.8 10*3/uL (ref 0.7–4.0)
Monocytes Absolute: 0.7 10*3/uL (ref 0.1–1.0)
Monocytes Relative: 11 % (ref 3–12)

## 2011-01-02 LAB — CBC
HCT: 41.6 % (ref 39.0–52.0)
Hemoglobin: 14 g/dL (ref 13.0–17.0)
MCV: 87.1 fL (ref 78.0–100.0)
RBC: 4.77 MIL/uL (ref 4.22–5.81)
WBC: 6.4 10*3/uL (ref 4.0–10.5)

## 2011-01-02 LAB — POCT I-STAT, CHEM 8
BUN: 24 mg/dL — ABNORMAL HIGH (ref 6–23)
Creatinine, Ser: 1.2 mg/dL (ref 0.4–1.5)
Glucose, Bld: 96 mg/dL (ref 70–99)
Hemoglobin: 14.6 g/dL (ref 13.0–17.0)
TCO2: 21 mmol/L (ref 0–100)

## 2011-01-07 LAB — BASIC METABOLIC PANEL
CO2: 25 mEq/L (ref 19–32)
CO2: 26 mEq/L (ref 19–32)
Calcium: 8.9 mg/dL (ref 8.4–10.5)
Calcium: 9.5 mg/dL (ref 8.4–10.5)
Chloride: 105 mEq/L (ref 96–112)
Chloride: 107 mEq/L (ref 96–112)
Creatinine, Ser: 1.12 mg/dL (ref 0.4–1.5)
Creatinine, Ser: 1.18 mg/dL (ref 0.4–1.5)
GFR calc Af Amer: >60 mL/min (ref >60)
GFR calc Af Amer: >60 mL/min (ref >60)
Glucose, Bld: 140 mg/dL — ABNORMAL HIGH (ref 70–99)
Sodium: 140 mEq/L (ref 135–145)

## 2011-01-07 LAB — CBC
Hemoglobin: 14.7 g/dL (ref 13.0–17.0)
Hemoglobin: 15.2 g/dL (ref 13.0–17.0)
MCHC: 33.4 g/dL (ref 30.0–36.0)
MCHC: 33.8 g/dL (ref 30.0–36.0)
MCV: 87.3 fL (ref 78.0–100.0)
RBC: 5 MIL/uL (ref 4.22–5.81)
RBC: 5.2 MIL/uL (ref 4.22–5.81)
RDW: 14.3 % (ref 11.5–15.5)
WBC: 6.5 10*3/uL (ref 4.0–10.5)

## 2011-01-07 LAB — POCT CARDIAC MARKERS
CKMB, poc: 1.7 ng/mL (ref 1.0–8.0)
Myoglobin, poc: 142 ng/mL (ref 12–200)
Troponin i, poc: <0.05 ng/mL (ref 0.00–0.09)

## 2011-01-07 LAB — DIFFERENTIAL
Basophils Relative: 1 % (ref 0–1)
Lymphs Abs: 1.8 10*3/uL (ref 0.7–4.0)
Monocytes Absolute: 0.6 10*3/uL (ref 0.1–1.0)
Monocytes Relative: 9 % (ref 3–12)
Neutro Abs: 3.9 10*3/uL (ref 1.7–7.7)
Neutrophils Relative %: 61 % (ref 43–77)

## 2011-01-07 LAB — URINALYSIS, ROUTINE W REFLEX MICROSCOPIC
Glucose, UA: NEGATIVE mg/dL
Hgb urine dipstick: NEGATIVE
Specific Gravity, Urine: 1.006 (ref 1.005–1.030)
Urobilinogen, UA: 0.2 mg/dL (ref 0.0–1.0)

## 2011-01-07 LAB — LIPID PANEL
HDL: 32 mg/dL — ABNORMAL LOW (ref >39)
Total CHOL/HDL Ratio: 4.4 RATIO
Triglycerides: 119 mg/dL (ref <150)
VLDL: 24 mg/dL (ref 0–40)

## 2011-01-07 LAB — CARDIAC PANEL(CRET KIN+CKTOT+MB+TROPI)
Relative Index: INVALID (ref 0.0–2.5)
Relative Index: INVALID (ref 0.0–2.5)
Relative Index: INVALID (ref 0.0–2.5)
Total CK: 38 U/L (ref 7–232)
Total CK: 43 U/L (ref 7–232)
Troponin I: 0.01 ng/mL (ref 0.00–0.06)
Troponin I: <0.01 ng/mL (ref 0.00–0.06)

## 2011-01-07 LAB — RAPID URINE DRUG SCREEN, HOSP PERFORMED
Amphetamines: NOT DETECTED
Barbiturates: NOT DETECTED
Benzodiazepines: NOT DETECTED
Tetrahydrocannabinol: NOT DETECTED

## 2011-01-07 LAB — HEMOGLOBIN A1C: Mean Plasma Glucose: 126 mg/dL

## 2011-01-28 ENCOUNTER — Other Ambulatory Visit: Payer: Self-pay | Admitting: Internal Medicine

## 2011-01-30 ENCOUNTER — Ambulatory Visit (INDEPENDENT_AMBULATORY_CARE_PROVIDER_SITE_OTHER): Payer: Medicare Other | Admitting: Internal Medicine

## 2011-01-30 ENCOUNTER — Other Ambulatory Visit: Payer: Self-pay | Admitting: Internal Medicine

## 2011-01-30 ENCOUNTER — Encounter: Payer: Self-pay | Admitting: Internal Medicine

## 2011-01-30 VITALS — BP 126/54 | HR 60 | Temp 98.0°F | Ht 67.0 in | Wt 154.1 lb

## 2011-01-30 DIAGNOSIS — F0151 Vascular dementia with behavioral disturbance: Secondary | ICD-10-CM

## 2011-01-30 DIAGNOSIS — I10 Essential (primary) hypertension: Secondary | ICD-10-CM

## 2011-01-30 DIAGNOSIS — R51 Headache: Secondary | ICD-10-CM

## 2011-01-30 DIAGNOSIS — E785 Hyperlipidemia, unspecified: Secondary | ICD-10-CM

## 2011-01-30 MED ORDER — TRAMADOL-ACETAMINOPHEN 37.5-325 MG PO TABS: 1.0000 | ORAL_TABLET | Freq: Four times a day (QID) | ORAL | Status: DC | PRN

## 2011-01-30 MED ORDER — LISINOPRIL 40 MG PO TABS: 40.0000 mg | ORAL_TABLET | Freq: Every day | ORAL | Status: DC

## 2011-01-30 MED ORDER — METHOCARBAMOL 500 MG PO TABS: 500.0000 mg | ORAL_TABLET | Freq: Three times a day (TID) | ORAL | Status: DC

## 2011-01-30 MED ORDER — AMLODIPINE BESYLATE 10 MG PO TABS: 10.0000 mg | ORAL_TABLET | Freq: Every day | ORAL | Status: DC

## 2011-01-30 MED ORDER — HYDRALAZINE HCL 50 MG PO TABS: 50.0000 mg | ORAL_TABLET | Freq: Four times a day (QID) | ORAL | Status: DC

## 2011-01-30 MED ORDER — MIRTAZAPINE 15 MG PO TABS: 15.0000 mg | ORAL_TABLET | Freq: Every day | ORAL | Status: DC

## 2011-01-30 MED ORDER — DIVALPROEX SODIUM 250 MG PO DR TAB: 250.0000 mg | DELAYED_RELEASE_TABLET | Freq: Three times a day (TID) | ORAL | Status: DC

## 2011-01-30 MED ORDER — PAROXETINE HCL 20 MG PO TABS: 20.0000 mg | ORAL_TABLET | Freq: Every day | ORAL | Status: DC

## 2011-01-30 MED ORDER — ESOMEPRAZOLE MAGNESIUM 40 MG PO CPDR: 40.0000 mg | DELAYED_RELEASE_CAPSULE | Freq: Every day | ORAL | Status: DC

## 2011-01-30 MED ORDER — TAMSULOSIN HCL 0.4 MG PO CAPS: 0.4000 mg | ORAL_CAPSULE | Freq: Every day | ORAL | Status: DC

## 2011-01-30 MED ORDER — LOVASTATIN 40 MG PO TABS: 40.0000 mg | ORAL_TABLET | Freq: Every day | ORAL | Status: DC

## 2011-01-30 NOTE — Assessment & Plan Note (Signed)
Doing reasonably well on remeron on depakote - cont same Prior MRI brain 09/2008 reviewed - no neuro or psyc changes

## 2011-01-30 NOTE — Assessment & Plan Note (Signed)
The current medical regimen is effective;  continue present plan and medications. BP Readings from Last 3 Encounters:  01/30/11 126/54  11/01/10 120/60  08/02/10 102/52

## 2011-01-30 NOTE — Patient Instructions (Signed)
It was good to see you today. Medications reviewed, no changes in usual medications at this time. For headache and muscle spasm, use tramadol and/or robaxin for pain as needed - Your prescription(s) have been submitted to your pharmacy. Please take as directed and contact our office if you believe you are having problem(s) with the medication(s). Refill on medication(s) as discussed today. Please schedule followup in 3-4 months, call sooner if problems.

## 2011-01-30 NOTE — Progress Notes (Signed)
  Subjective:    Patient ID: Erik [name], male    DOB: Jul 14, 1941, 70 y.o.   MRN: [id number]  HPI  here for followup:  dementia on remeron and depakote - fluctations in eratic behavior - dtr's letter stating concerns reviewed and scanned into EMR 10/2010 OV unable to complete cog eval 07/12/10 with [facility] due to lack of participation with testing  htn - reports compliance with ongoing medical treatment and no changes in medication dose or frequency. denies adverse side effects related to current therapy.  no cp or dizziness  mood do - reports compliance with ongoing medical treatment and no changes in medication dose or frequency. denies adverse side effects related to current therapy. less irritable per family, no loss of appetite and less onging weight loss - "i'm ready for the lord to take me" but denies sadness.  dyslipidemia - reports compliance with ongoing medical treatment and no changes in medication dose or frequency. denies adverse side effects related to current therapy. no GI or muscle problems  hx lung ca surg - saw [facility] again early 2011 for same because of inc pain in inscional hernia (no n/v or inablilty to reduce) - no problems indetified on exam or CT  bph - hard to empty bladder - occ dribble symptoms - no hematuria or dysuria - never on med for same  Past Medical History  Diagnosis Date  . Hypertension   . COPD (chronic obstructive pulmonary disease)   . BPH (benign prostatic hyperplasia)   . Hyperlipidemia   . Dementia, vascular, with delusions   . Mood disorder   . 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 Lung cancer     R lung resection  . CAD (coronary artery disease)     CABG  . AAA (abdominal aortic aneurysm)   . GERD (gastroesophageal reflux disease)      Review of Systems  Respiratory: Negative for cough and shortness of breath.   Musculoskeletal: Negative for back pain.  Neurological: Positive for headaches. Negative for seizures and  syncope.       Objective:   Physical Exam BP 126/54  Pulse 60  Temp(Src) 98 F (36.7 C) (Oral)  Ht 5\' 7"  (1.702 m)  Wt 154 lb 1.3 oz (69.89 kg)  BMI 24.13 kg/m2  SpO2 96%  Physical Exam  Constitutional:  oriented to person, place, and time. appears well-developed and well-nourished. No distress.  Dtr at side Neck: Normal range of motion. Neck supple. No JVD present. No thyromegaly present.  Cardiovascular: Normal rate, regular rhythm and normal heart sounds.  No murmur heard. Pulmonary/Chest: Effort normal and breath sounds normal. No respiratory distress. no wheezes.  Musculoskeletal: Normal range of motion. Patient exhibits no edema.  Neurological: he is alert and oriented to person, place, and time. No cranial nerve deficit. Coordination normal. chronic LHP L>UE, walks with cane asst Psychiatric: he has a eccentric expansive mood and affect. Tangential though - hyperactive behavior. Judgement impaired. Lab Results  Component Value Date   LDLCALC 67 11/01/2010       MRI brain 10/06/2008 reviewed - no acute, no mass - chronic vasc changes  Assessment & Plan:  See problem list. Medications and labs reviewed today. suspect headache related to muscle spasm - will add prn robaxin to prn ultracet - no new neuro deficits on exam - pt/dtr to call if symptoms worse

## 2011-01-30 NOTE — Assessment & Plan Note (Signed)
On statin - The current medical regimen is effective;  continue present plan and medications. Lab Results  Component Value Date   LDLCALC 67 11/01/2010

## 2011-02-01 ENCOUNTER — Telehealth: Payer: Self-pay

## 2011-02-01 MED ORDER — OMEPRAZOLE 20 MG PO CPDR: 20.0000 mg | DELAYED_RELEASE_CAPSULE | Freq: Every day | ORAL | Status: DC

## 2011-02-01 NOTE — Telephone Encounter (Signed)
Erik [name], pharmacist at [facility] called to inform patient and family want less expensive alternative to Nexium. Please advise.

## 2011-02-01 NOTE — Telephone Encounter (Signed)
Stop nexium - start omeprazole - erx done

## 2011-02-05 NOTE — Assessment & Plan Note (Signed)
OFFICE VISIT   RAJAN, BURGARD  DOB:  02/01/1941                                        October 30, 2009  CHART #:  [id number]   HISTORY:  The patient is a 70 year old gentleman who I did a right upper  lobectomy back in November 2006.  He had been lost to followup to me by  October 2007.  He was continuing to see other physicians.  It seems  recently, he has been complaining of a hernia at the right costal  margin.  He said it will pop out, is about the size of an egg.  If he  lays down, it goes back in.  He is unable to demonstrate that for me in  the office today.  This is not in association with any of his previous  incisions.   CURRENT MEDICATIONS:  1. Aspirin 325 mg daily.  2. Lovastatin 40 mg daily.  3. Lisinopril 40 mg daily.  4. Nexium 40 mg daily.  5. Paxil 20 mg daily.  6. Amlodipine 10 mg daily.  7. Hydralazine 50 mg q.i.d.   ALLERGIES:  He is allergic to hydrochlorothiazide.   PHYSICAL EXAMINATION:  General:  The patient is a 70 year old gentleman,  in no acute stress.  Vital Signs:  His blood pressure is 130/65, pulse  is 78, respirations are 18, and his oxygen saturation is 96% on room  air.  Lungs:  Diminished equal breath sounds bilaterally.  His  thoracotomy wound is well healed as are his chest tube sites from both  his coronary bypass grafting as well as his lobectomy.  There is no  herniation at any of these sites.  He is unable to reproduce the hernia  with palpation.  There is a questionable fascial defect below the right  costal margin but nothing definite.   A CT scan was done on October 23, 2009, which showed no evidence of  recurrent disease, but I see no evidence of hernia on the CT scan.   IMPRESSION:  1. Lung cancer.  The patient is now 4-1/2 years out with no evidence      of recurrent disease with a stage IA non-small cell cancer.  I      think, at this point, he can consider 5-year followup and no      further  imaging is needed other than an annual chest x-ray with      physical examination.  2. Question of a subcostal hernia.  His story is very good for hernia      although he cannot demonstrate it today, and I cannot really feel      any direct evidence that there is a hernia, that is certainly not      associated with any of his incisions.  I did warn him that if he      were to feel this thing pop out as he describes it and not go right      back in, then he should seek medical attention immediately.  In the      meantime, we will see if we can get him an appointment to see a      general surgeon to see if they think there really is a hernia, that      is a very unusual place for one.  So, I am not sure that I will      really be able to do much with it unless he can actually      demonstrate it, but I would be happy to see the patient back in the      future at anytime if I could be of any further assistance with his      care.   Salvatore Decent Dorris Fetch, M.D.  Electronically Signed   SCH/MEDQ  D:  10/30/2009  T:  10/31/2009  Job:  [id number]   cc:   Vikki Ports A. Felicity Coyer, MD

## 2011-02-05 NOTE — H&P (Signed)
NAME:  Erik [name]              ACCOUNT NO.:  [id number]   MEDICAL RECORD NO.:  [id number]          PATIENT TYPE:  INP   LOCATION:  0107                         FACILITY:  [facility]   PHYSICIAN:  Peggye Pitt, M.D. DATE OF BIRTH:  1941-06-18   DATE OF ADMISSION:  10/05/2008  DATE OF DISCHARGE:                              HISTORY & PHYSICAL   PRIMARY CARE PHYSICIAN:  Dr. Lorenz Coaster.   CHIEF COMPLAINT:  Dizziness/syncope.   HISTORY OF PRESENT ILLNESS:  Erik [name] is a pleasant 70 year old  Caucasian man who looks much older than his age, who was at his home  today petting his cats, standing up, when suddenly he felt very dizzy,  felt like the room was spinning around him and felt like he was going to  pass out.  The next thing he remembers is lying down on the floor and  his daughter, Alise standing over him.  Subsequently his son came in and  when described of the events decided to call EMS to make him into the  hospital.  The patient denies any chest pain, shortness of breath, any  diaphoresis, fever, chills or any other symptoms.  Because of his  syncope, the hospitalist of service was called to admit him for further  evaluation and management.   ALLERGIES:  He has no known drug allergies.   PAST MEDICAL HISTORY:  Significant for:  1. Hypertension.  2. Hyperlipidemia.  3. GERD.  4. Coronary artery disease, status post CABG in 1999.  5. He also had a cholecystectomy in 2003.  6. He had non-small cell cancer of his lung for which he underwent      surgical resection.   SOCIAL HISTORY:  He was a smoker, quit over 10 years ago.  Denies any  alcohol or illicit drug use.  He is married and lives with his wife and  his son.   FAMILY HISTORY:  Noncontributory in this elderly gentleman.   REVIEW OF SYSTEMS:  Negative except as already mentioned on HPI.   MEDICATIONS:  His home medications include:  1. Aspirin 81 mg daily.  2. Nexium 40 mg daily.  3. Lisinopril 40 mg daily.  4. Lovastatin 40 mg daily.   PHYSICAL EXAMINATION:  VITAL SIGNS UPON ADMISSION:  blood pressure  197/129 which subsequently dropped to 173/83, heart rate 65,  respirations 18, O2 sats 97% on room air with a temperature of 97.6.  GENERAL:  He is alert, awake, oriented x3, does not appear to be in any  distress.  HEENT: Normocephalic, atraumatic.  Pupils are reactive to light and  accommodation.  His extraocular movements are intact.  He has no teeth.  LUNGS:  Clear to auscultation bilaterally.  HEART:  Regular rate and rhythm with no murmurs, rubs or gallops.  ABDOMEN:  Soft, nontender, nondistended with positive bowel sounds.  EXTREMITIES: Trace pitting edema bilaterally.  He has a brace over his  left leg that he has since he had a light stroke a few years back.  NEUROLOGICAL:  Mental status is intact.  His muscle strength is 5/5  bilateral and symmetric.  His  DTRs are 2+ symmetric.  His cranial nerves  II-XII are intact.  His sensation is intact to light touch.  His finger-  to-nose is normal.  Heel-to-shin is normal.  His Babinski is downgoing  bilaterally.  I have not ambulated him.   LABORATORY DATA UPON ADMISSION:  Sodium 140, potassium 4.5, chloride  107, bicarb 26, BUN 15, creatinine 1.12 with a glucose of 96.  WBCs 6.5,  hemoglobin 14.7, and platelets of 173.  Urinalysis is negative.  First  set of point of care markers is negative.   He had a chest x-ray that shows no acute disease.   He had a CT scan of his head without contrast that showed bilateral  chronic lacunar infarcts in the basal ganglia and periventricular white  matter with atrophy and chronic small vessel white matter disease.   ASSESSMENT AND PLAN:  1. Syncope.  With his preceding dizziness, there are several things      that we need to rule out.  2. Arrhythmia and for that reason, we will monitor him on telemetry;      another possibility is acute coronary syndrome, especially with his      history of  coronary artery disease, for this reason, we will      monitor him on telemetry, as well as cycle EKGs and cardiac enzymes      2.  Another thing we need to consider is orthostasis and for this,      we will check orthostatic vital signs.  Another thing, and what is      most concerning to me, is the possibility of a posterior      circulation cerebrovascular accident which is high on my      differential especially given his history of prior strokes, as well      as his risk factors, as well as his very elevated blood pressure      upon initial presentation.  For this reason, we will order we will      order an MRI of the brain.  Although he denies any drug use, we      will also check a UDS just to confirm.  3. For his hypertension, for now will just continue his home      medications, because if he were indeed to have a stroke, will not      want to drop his blood pressure initially.  4. For his hyperlipidemia, will check a fasting lipid panel and      continue his home dose of statin.  5. For his gastroesophageal reflux disease, we will continue on a PPI.  6. For prophylaxis while in the hospital place on Protonix for GI      prophylaxis and Lovenox for DVT prophylaxis.      Peggye Pitt, M.D.  Electronically Signed     EH/MEDQ  D:  10/05/2008  T:  10/05/2008  Job:  [id number]   cc:   Reuben Likes, M.D.  Fax: [phone]

## 2011-02-05 NOTE — Discharge Summary (Signed)
NAME:  Erik [name], Erik [name]              ACCOUNT NO.:  [id number]   MEDICAL RECORD NO.:  [id number]          PATIENT TYPE:  INP   LOCATION:  1406                         FACILITY:  [facility]   PHYSICIAN:  Marcellus Scott, MD     DATE OF BIRTH:  [birth year]   DATE OF ADMISSION:  10/05/2008  DATE OF DISCHARGE:  10/07/2008                               DISCHARGE SUMMARY   Erik [name] MEDICAL DOCTOR/>  Unassigned.  Previous primary M.D. was Dr. Lorenz Coaster.   DISCHARGE DIAGNOSES:  1. Syncope/presyncope.  2. Uncontrolled hypertension.  3. Dyslipidemia.  4. History of coronary artery disease status post coronary artery      bypass graft.  5. History of cerebrovascular accident peri-coronary artery bypass      graft.  6. Dyslipidemia.  7. Gastroesophageal reflux disease.   DISCHARGE MEDICATIONS:  1. Enteric-coated aspirin 81 mg p.o. daily.  2. Nexium 40 mg p.o. daily.  3. Lisinopril 40 mg p.o. daily.  4. Lovastatin 40 mg p.o. daily.  5. Hydralazine 10 mg p.o. q.i.d.   PROCEDURES:  1. Carotid Dopplers.  Right internal carotid artery - There was      localize moderate and mixed block in the proximal segment of the      vessel on the posterior wall with some acoustic shadowing.  No      significant right or left internal carotid artery stenosis.  Right      external carotid artery stenosis noted.  Mild left external carotid      artery stenosis noted.  Vertebral artery flow antegrade      bilaterally.  2. A 2D echocardiogram with overall left ventricular systolic function      normal.  Left ventricle ejection fraction estimated 60%.  Left      ventricular wall thickness was increased in a pattern of mild      concentric hypertrophy.  Doppler parameters were consistent with      abnormal left ventricular relaxation.  3. MRI of the brain without contrast.  Impression:  Atrophy and      chronic microvascular ischemia.  No acute abnormality.  4. CT of the head without contrast.  Impression:  Stable  exam.      Patient has bilateral chronic lacunar infarctions in the basal      ganglia and periventricular white matter.  No new or acute interval      findings.  Chronic small vessel white matter disease.  Diffuse loss      of parenchymal volume consistent with atrophy.  5. Chest x-ray on October 05, 2008, is chronic lung changes.  No acute      pulmonary findings.   PERTINENT LABS:  Basic metabolic panel today unremarkable with BUN 14,  creatinine of 1.18, however, sugar was 140.  CBC is with hemoglobin  15.2, hematocrit 45.4, white blood cell 11.5, platelets 176, TSH 1.411.  Cardiac panel cycled and negative.  Urine drug screen was negative.  Lipid panel remarkable for HDL of 32, rest of it is unremarkable.  Hemoglobin A1c 6.  Urinalysis with no proteinuria and no features  suggestive of urinary tract  infection.   CONSULTATIONS:  None.   HOSPITAL COURSE AND PATIENT DISPOSITION:  Erik [name] is a 70 year old  gentleman with history of hypertension, hyperlipidemia, coronary artery  disease status post CABG, lung cancer status post surgical resection who  while at home after standing for some time felt dizzy and felt the room  was spinning around him and felt like he was going to pass out.  Subsequently, he actually did pass out and was found lying on the floor  by his daughter.  He was subsequently brought to the emergency room for  further evaluation and management.   1. Syncope.  Patient was admitted to telemetry.  His telemetry showed      mostly sinus rhythm in the 60s but he had occasional sinus      bradycardia greater than 45 beats per minute but asymptomatic.      Extensive evaluation was done including MRI to rule out posterior      circulation stroke which was ruled out.  He had carotid Dopplers      and echocardiogram with findings as above.  Physical therapy and      occupational therapy evaluation was done and they indicated that      patient was at baseline and did  not see any further needs.      However, on checking his blood pressures, patient's blood pressure      was markedly elevated on admission of 197/129.  Today they are      mainly in the 150s to 160s/80s to 90s.  However, when orthostatic      blood pressures are checked, they are positive with a systolic drop      of blood pressure from 154 to 126 and diastolic drop of 94 to 76      mmHg.  Patient had been started on hydrochlorothiazide yesterday.      We will obviously stop this given his history of orthostatic drop      in blood pressures.  Patient will continue his lisinopril and we      will add hydralazine.  He and his family have been advised      regarding gradual movement from lying to sitting and sitting to      standing.  He also has been advised to use TED stockings which his      daughter says that she has at home.  He is to follow up with      primary MD in approximately a week's time to reevaluate his blood      pressure and adjust medications.  To consider adding an ARB such as      Cozaar if his blood pressures continue to be high.  Obviously, if      he has recurrent episodes of syncope, he has to be further      evaluated which may involve a cardiology consultation.  At this      time, patient is stable for discharge home with family.  2. Uncontrolled hypertension/Hypertency urgency on admission.      Management per problem #1.  3. Dyslipidemia.  Continue statins.  4. History of coronary artery disease status post CABG.  No chest pain      and ruled out for MI.  5. History of CVA in the past, continue aspirin.   Family and patient indicate that they do not follow up with Dr. Lorenz Coaster  because of insurance issues.  They have Medicare B+.  I have called Dr.  Harvette Lovell Sheehan' office and had them speak with the patient.  They will  call him for an appointment.  Patient is to be seen in approximately a  week's time.   Time spent: 30 mins.      Marcellus Scott, MD   Electronically Signed     AH/MEDQ  D:  10/07/2008  T:  10/07/2008  Job:  [id number]   cc:   Reuben Likes, M.D.  Fax: [phone]   Della Goo, M.D.  Fax: [phone]
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
## 2011-02-08 NOTE — Op Note (Signed)
NAME:  Erik [name], Erik [name]                        ACCOUNT NO.:  [id number]   MEDICAL RECORD NO.:  [id number]                   PATIENT TYPE:  INP   LOCATION:  3739                                 FACILITY:  [facility]   PHYSICIAN:  Douglas A. Magnus Ivan, M.D.           DATE OF BIRTH:  26-Apr-1941   DATE OF PROCEDURE:  06/21/2002  DATE OF DISCHARGE:                                 OPERATIVE REPORT   PREOPERATIVE DIAGNOSES:  Acute cholecystitis.   POSTOPERATIVE DIAGNOSES:  Acute cholecystitis.   OPERATION PERFORMED:  Laparoscopic cholecystectomy with intraoperative  cholangiogram.   SURGEON:  Douglas A. Magnus Ivan, M.D.   ASSISTANT:  Jimmye Norman, M.D.   ANESTHESIA:  General endotracheal.   DRAINS:  102 French Blake drain.   OPERATIVE FINDINGS:  The patient was found to have acute cholecystitis with  abscess of the gallbladder.  Cholangiogram was normal.   DESCRIPTION OF PROCEDURE:  The patient was brought to the operating room and  identified.  He was placed supine on the operating table and general  anesthesia was induced.  His abdomen was prepped and draped in the usual  sterile fashion.  Using a #15 blade a small transverse incision was made  below the umbilicus.  The incision was carried down to the fascia which was  opened with a scalpel and used to pass into the peritoneal cavity.  Next, a  0 Vicryl pursestring suture was placed around the fascial opening.  The  Hasson port was placed through the opening and insufflation of the abdomen  was begun.  Next a 12 mm port was placed in the patient's epigastrium and  two 5 mm ports were placed in the patient's right flank under direct vision.  The gallbladder was found to be acutely inflamed and have a large amount of  adhesions to this which were taken down bluntly.  The gallbladder was found  to be near gangrenous and acutely distended.  Needle aspiration was then  used to aspirate white purulent appearing bile.  The gallbladder was  grasped, abscess was found behind the gallbladder in the liver wall.  The  cystic duct was then finally dissected out.  It was clipped proximally and  then finally transected with scissors.  A cholangiocatheter was inserted  under direction vision in the right upper quadrant through an Angiocath.  The cholangiocatheter was then placed into the cystic duct.  A cholangiogram  was then performed under direct fluoroscopy and showed good flow into the  common bile duct as well as the proximal liver radicals and into the distal  common bile duct and duodenum.  The cholangiocath was then removed under  direct vision.  The cystic duct was then clipped three times proximally and  then completely transected.  The cystic artery and a branch were then  identified and clipped several times proximally and once distally and  transected as well.  The gallbladder was then dissected free from  the liver  bed with electrocautery.  Again, a large amount of purulence was found in  the gallbladder.  The gallbladder was very necrotic and multiple stones fell  out of the gallbladder and removed from the liver bed.  The gallbladder was  then placed in an Endo sac.  A separate Endo sac was then used to collect  several of the large stones from the gallbladder.  The gallbladder bed and  abdomen was then copiously irrigated with several liters of normal saline.  Hemostasis was then achieved in the liver bed with the electrocautery.  Two  pieces of Surgicel were also placed in the liver bed.  A 19 [place] Blake  drain was then placed through the epigastric port and then pulled out  through a lateral port.  The drain was put into the gallbladder fossa.  The  drain was then sewn in place with nylon drain.  A separate figure-of-eight 0  Vicryl suture was also placed at the umbilical incision after the umbilical  port was removed to close the fascial defect.  The abdomen once again was  copiously irrigated with normal saline.   Hemostasis appeared to be achieved.  All ports were removed under direct vision and the abdomen was deflated.  The Blake drain was then hooked up to bulb suction.  All incisions were then  anesthetized with 0.25% Marcaine and then closed with 4-0 Vicryl  subcuticular sutures.  Steri-Strips, gauze and tape were then applied.  The  patient tolerated the procedure well.  All sponge, needle and instrument  counts were correct at the end of the procedure.  The patient was then  extubated in the operating room and taken in stable condition to the  recovery room.                                                  Douglas A. Magnus Ivan, M.D.    DAB/MEDQ  D:  06/21/2002  T:  06/22/2002  Job:  [id number]

## 2011-02-08 NOTE — Discharge Summary (Signed)
NAME:  Erik [name], Erik [name]                        ACCOUNT NO.:  [id number]   MEDICAL RECORD NO.:  [id number]                   PATIENT TYPE:  INP   LOCATION:  3739                                 FACILITY:  [facility]   PHYSICIAN:  Abigail Miyamoto, MD                DATE OF BIRTH:  August 06, 1941   DATE OF ADMISSION:  06/19/2002  DATE OF DISCHARGE:  06/24/2002                                 DISCHARGE SUMMARY   DISCHARGE DIAGNOSIS:  Acute cholecystitis with abscess.  He is status post  laparoscopic cholecystectomy and intraoperative cholangiogram.   HISTORY:  The patient is a 70 year old gentleman who was admitted with  abdominal pain to Dr. Lorenz Coaster.  At the time of admission his abdomen showed  tenderness in the right upper quadrant.  His liver function tests at that  time were normal.  His white blood count was 10,000 and his hemoglobin was  14.8.  Because of his abdominal pain he was admitted for evaluation, cardiac  workup, etc., in order to determine his abdominal pain.  General surgery was  consulted on 06/20/2002 after ultrasound showed cholelithiasis.  At that  time it was not sure whether this represented acute cholecystitis so a HIDA  scan was performed.  On 06/21/2002 a HIDA scan was performed which showed  apparent findings of cholecystitis therefore a decision was made to proceed  to the operating room.  The patient was taken to the operating room on  06/21/2002 where he underwent a laparoscopic cholecystectomy with  intraoperative cholangiogram.  He was found to have acute cholecystitis with  abscess of gallbladder.  A Blake drain was left in place.  Cholangiogram was  normal.   Postoperatively the patient did well and was kept on antibiotics for the  next several days with a Blake drain in place.  The drainage slowly  decreased and he also defervesced.  By 06/24/2002 the decision was made to  remove his drain as the output was minimal.  Because he was doing well,  tolerating a  regular diet, and was afebrile the decision was made to  discharge the patient home.   DISCHARGE DIET:  Regular.   DISCHARGE ACTIVITY:  He is to do no heavy lifting for approximately four  weeks post discharge.  He is to resume his  home medications.  He will take  Vicodin and Advil for pain.  He will continue Cipro 500 mg twice daily for  seven days.  He will follow up in my office in 1-2 weeks post discharge.  He  will also follow up with Dr. Leslee Home 2-3 weeks post discharge.                                               Abigail Miyamoto, MD    DB/MEDQ  D:  07/20/2002  T:  07/21/2002  Job:  [id number]

## 2011-02-08 NOTE — Consult Note (Signed)
NAME:  Erik [name], Erik [name]                        ACCOUNT NO.:  [id number]   MEDICAL RECORD NO.:  [id number]                   PATIENT TYPE:  INP   LOCATION:  3739                                 FACILITY:  [facility]   PHYSICIAN:  Gabrielle Dare. Janee Morn, M.D.             DATE OF BIRTH:  May 19, 1941   DATE OF CONSULTATION:  06/20/2002  DATE OF DISCHARGE:                                   CONSULTATION   REASON FOR CONSULTATION:  Evaluate for cholecystitis.   HISTORY OF PRESENT ILLNESS:  The patient is a 70 year old male who was  admitted yesterday with chest pain. He does have some cardiac history with a  CABG in 1999. Currently, the patient denies any pain in his abdomen or in  his chest. He has not been having any nausea or vomiting and denies any  other abdominal complaints. He claims the pain that he had had in his lower  mid chest is gone.   PAST MEDICAL HISTORY:  Coronary artery disease with hyperlipidemia and old  CVA with resultant left hemiparesis.   MEDICATIONS:  1. Aspirin.  2. Prinivil.  3. Toprol.  4. Plavix.   PAST SURGICAL HISTORY:  As above.   PHYSICAL EXAMINATION:  GENERAL:  The patient has a temperature of 101.3. His  blood pressure is 130/80. Heart rate is 75. His respirations are 20. He is  98% saturated on room air.  GENERAL:  In general, he is awake and alert in no acute distress. He is  actually standing up, bathing when I went to see him.  NECK EXAM:  His neck is supple.  HEENT:  His mucous membranes are moist.  CHEST:  Clear to auscultation bilaterally.  HEART:  Regular, rate, and rhythm.  ABDOMEN:  Soft. No tenderness in the epigastrium or right upper quadrant can  be appreciated at this time. He does have bowel sounds and no other  tenderness in his abdomen.  EXTREMITIES:  He does have some weakness to plantar and dorsiflexion in his  left lower extremity. Otherwise, his extremities are warm.   LABORATORY DATA:  Sodium 136, potassium 3.9, chloride 104, CO2  25, BUN 21,  creatinine 1.4, glucose 154. His white count is 13.3, hemoglobin 13.6,  hematocrit 39.9, platelets 156. His  bilirubin is 0.8, alkaline phosphatase  is 70, SGOT is 85, SGPT is 91, total protein is 5.8, albumin is 3, and  lipase is 23. His ultrasound of his gallbladder showed cholelithiasis, but  there was no gallbladder wall thickening and no pericholecystic fluid and no  evidence on ultrasound of acute cholecystitis.    IMPRESSION:  The patient is a 70 year old male with a cardiac history and  status post CVA with cholecystitis. I do not feel he has acute cholecystitis  at this time as the patient has no pain, no tenderness, and no ultrasound  findings consistent with cholecystitis. I would recommend continue his other  workup as  ordered, and I will follow along with you, and I will give Dr.  Lorenz Coaster a call.                                               Gabrielle Dare Janee Morn, M.D.    BET/MEDQ  D:  06/20/2002  T:  06/23/2002  Job:  [id number]

## 2011-02-08 NOTE — Op Note (Signed)
NAME:  Erik [name], Erik [name]              ACCOUNT NO.:  [id number]   MEDICAL RECORD NO.:  [id number]          PATIENT TYPE:  INP   LOCATION:  3314                         FACILITY:  [facility]   PHYSICIAN:  Salvatore Decent. Dorris Fetch, M.D.DATE OF BIRTH:  01-Apr-1941   DATE OF PROCEDURE:  07/25/2005  DATE OF DISCHARGE:                                 OPERATIVE REPORT   PREOPERATIVE DIAGNOSIS:  Non-small-cell carcinoma, right upper lobe.   POSTOPERATIVE DIAGNOSIS:  Non-small-cell carcinoma, right upper lobe.   PROCEDURE:  1.  Bronchoscopy.  2.  Right video-assisted thoracic surgery.  3.  Wedge resection of right lower lobe.  4.  Right upper lobectomy.  5.  Mediastinal lymph node dissection.   SURGEON:  Salvatore Decent. Dorris Fetch, MD   ASSISTANT:  Pecola Leisure, PA   ANESTHESIA:  General.   FINDINGS:  1.  Bronchoscopy:  Normal endobronchial anatomy, no endobronchial lesions.  2.  Right VATS:  Moderate adhesions anteriorly and posteriorly, particularly      medially along the pericardium.  One 1 palpable nodule in the right      lower lobe, medial aspect, along the major fissure.  3.  Wedge resection revealed intraparenchymal lymph node.  Two-and-a-half-      centimeter right upper lobe mass with puckering of the visceral pleura,      frozen section margins clear, non-small-cell carcinoma, possible      neuroendocrine component.   CLINICAL NOTE:  Erik [name] is a 70 year old gentleman with history of  tobacco abuse.  He now is found to have a new right upper lobe nodule.  A  needle biopsy was positive for non-small cell carcinoma.  PET scan revealed  hyperactivity in the primary nodule and no other evidence of metastases.  CT  scan showed no mediastinal or hilar adenopathy.  It did show, however, a  small nodule in the right lower lobe which was too small to characterize.  The patient was advised to undergo a surgical resection for potential cure.  The indications, risks, benefits and  alternatives were discussed in detail  with the patient and his family.  He understood and accepted the risks and  agreed to proceed.   OPERATIVE NOTE:  Erik [name] was brought to the preop holding area on  July 25, 2005.  There, lines were placed by the anesthesia service for  intravenous access centrally as well as arterial blood pressure monitoring.  Intravenous antibiotics were administered.  He was taken to the operating  room, and anesthetized and intubated with a single-lumen endotracheal tube.  PAS hose were placed for DVT prophylaxis and a Foley catheter was placed.  A  flexible fiberoptic bronchoscopy was performed via the endotracheal tube; it  revealed normal endobronchial anatomy and no endobronchial lesions.   Next, the endotracheal tube was replaced with a double-lumen endotracheal  tube.  The patient was placed in a left lateral decubitus position and the  right chest was prepped and draped in usual fashion.   Incision was made in the mid-axillary line in the 7th intercostal space.  It  was carried through the skin and subcutaneous tissue.  The chest was entered  bluntly using a hemostat.  Single-lung ventilation of the left lung was  carried out and the patient tolerated single-lung ventilation throughout the  procedure.  A port was inserted and the thoracoscope was placed via the  port.  A small utility thoracotomy was made in the midaxillary line at  approximately the 5th intercostal space; this was carried again through skin  and subcutaneous tissue.  The latissimus muscle was mobilized and retracted  posteriorly.  The serratus muscle was mobilized and retracted superiorly and  anteriorly.  The intercostal muscles were divided with electrocautery.  At  this point, it was determined that this was in fact the 6th interspace and  was too low and a second intercostal incision was made 1 interspace was  superiorly to improve exposure.  There were adhesions posteriorly;  these did  not appear malignant; they were thin filmy adhesions.  These were taken down  with electrocautery.  There were extensive adhesions anteriorly and medially  along the pericardium; takedown of these required sharp and blunt dissection  and cautery.  An attempt was made to palpate the right lower lobe lesion  through the thoracotomy without spreading the ribs; however, the lesion  could not be localized, therefore a small retractor was placed and the ribs  were spread, but not divided; this allowed palpation of the right lower lobe  nodule and it was resected with multiple firings of Endo GIA stapler and  sent for frozen section.  This turned out to be a benign intraparenchymal  lymph node.   The dissection then was carried out.  The minor fissure was completed.  The  superior pulmonary vein was identified anteriorly and then divided with an  endoscopic vascular stapler.  This exposed the posterior ascending branches  of the pulmonary artery to the right upper lobe and these were also divided  with a vascular stapler.  The dissection then was carried superiorly and  medially, identifying the anterior and apical segmental pulmonary artery  branches; these were divided with a vascular stapler as well.  The major  fissure then was completed with an Endo GIA stapler and finally the right  upper lobe bronchus was isolated and a Echelon 60 stapler was placed across  the bronchus using the yellow staple cartridge.  The stapler was closed.  Test inflation was performed on the lower and middle lobes with good  inflation of both and the stapler then was fired.  The specimen was removed  and sent for frozen section of the margins as well as the lesion.  This  returned with a non-small-cell carcinoma with possible neuroendocrine  component; the margins were clear.   Next, the level 10 and 11 lymph nodes that were visible were dissected off and sent for pathology.  The level 4R nodes also  were taken.  There was only  1 small node in the 4R area and this was sent as separate specimen.  The  subcarinal nodes then were explored and resected.  There was considerable  bleeding from the subcarinal nodes, but the nodes themselves appeared  benign.  The chest irrigated with 1 L of warm normal saline containing 1 g  of vancomycin and hemostasis was achieved.  A 28-French chest tube was  placed anteriorly and a 36-French right-angle tube was placed posteriorly.  A #2 Vicryl pericostal suture was used to reapproximate the interspace which  had been opened.  An On-Q local anesthetic catheter was placed in a  subpleural location.  The serratus was reattached with a running 0 Vicryl  suture.  The latissimus fascia which had been mobilize was closed with a 0  Vicryl  suture.  The subcutaneous tissues and skin were closed in standard fashion.  All sponge, needle and instrument counts were correct at the end of the  procedure.  The patient was taken from the operating room to the  postanesthetic care unit, extubated and in a stable condition.           ______________________________  Salvatore Decent Dorris Fetch, M.D.     SCH/MEDQ  D:  07/25/2005  T:  07/26/2005  Job:  [id number]   cc:   Reuben Likes, M.D.  Fax: [phone]   Lajuana Matte, MD  Fax: [phone]   Nicki Guadalajara, M.D.  Fax: [phone]
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
## 2011-02-08 NOTE — H&P (Signed)
NAME:  Erik [name], BARUCH                        ACCOUNT NO.:  [id number]   MEDICAL RECORD NO.:  [id number]                   PATIENT TYPE:  INP   LOCATION:  3739                                 FACILITY:  [facility]   PHYSICIAN:  Reuben Likes, M.D.               DATE OF BIRTH:  March 03, 1941   DATE OF ADMISSION:  06/19/2002  DATE OF DISCHARGE:                                HISTORY & PHYSICAL   CHIEF COMPLAINT:  Chest pain.   HISTORY OF PRESENT ILLNESS:  [name] is a 70 year old male who was  awakened out of sleep this morning at 12 o'clock midnight with severe, rated  10/10 intensity pain in the epigastrium with radiation to the lower chest.  This did not radiate into the back, into the neck, or down the arms. It was  associated with diaphoresis, nausea and vomiting of yellowish vomitus,  shortness of breath, and a loose stool. The patient denies any fevers,  chills, cough, rapid heart beat, dizziness, or blood in his vomitus or  stool. The pain lasted three hours and was relieved at the emergency room by  a dose of GI cocktail. There was no history of similar pain, either before  or since. He has no history of ulcers or gallbladder disease. The patient  had unstable angina in 3/99 and underwent a CABG. He had a CVA  postoperatively with left hemiparesis thereafter. The patient was  transported to the emergency room by ambulance this morning. He did run a  fever up to 102 around 9 a.m. this morning. He had not had a fever before  this. He denies any nasal congestion, sore throat, or urinary symptoms. He  is admitted now for further evaluation of his fever and also to rule out a  MI.   PAST MEDICAL HISTORY:  Surgeries:  The patient had the above mentioned CABG  in 1999, and no other surgeries in the past.  Other hospitalizations:  None.  Other illnesses:  1. Hypertension.  2. Hyperlipidemia.  3. Had a TIA in August with some slurring of the speech and stuttering that     went  away spontaneously and lasted about 30 minutes.   MEDICATIONS:  1. Aspirin one a day.  2. Prinivil 40 mg once a day.  3. Toprol-XL 50 mg once a day.  4. ____________ 10 mg once a day.  5. Plavix 75 mg once a day.   ALLERGIES:  None.   FAMILY HISTORY:  His father died in his 72s of a brain tumor. His mother  died of a CVA. He has four brothers and four sisters in good health and  three children, all in good health.   SOCIAL HISTORY:  He was an Journalist, newspaper. He did work for [employer]. He  was forced to retire and go on disability in 1999 after had his CABG and  stroke. He lives with a daughter. He  does not use alcohol or tobacco. He is  divorced.   REVIEW OF SYMPTOMS:  Denies any other systemic, skin, eye, ENT, respiratory,  cardiovascular, GI, GU, musculoskeletal, neurologic, or psychiatric  complaints.   PHYSICAL EXAMINATION:  VITAL SIGNS:  Blood pressure 209/111, respirations  17, temperature 97.1.  GENERAL:  Alert, in no distress.  SKIN:  Warm and diaphoretic. No rash or breakdown.  EYES:  Pupils are equal, round, and reactive to light. Full EOMs. Fundi  benign. Sclerae nonicteric.  ENT:  TMs normal. No oral lesions. He is edentulous. Mucous membranes are  moist. Pharynx clear.  NECK:  Supple, no adenopathy or mass, no JVD or bruit, thyroid normal.  LUNGS:  Clear to auscultation and percussion.  HEART:  Regular rhythm. No murmurs, gallops, or rubs.  CHEST:  No chest wall tenderness.  ABDOMEN:  Soft and flat. The patient has minimal epigastric tenderness to  palpation without guarding or rebound. Murphy's sign and Murphy's punch were  negative. No hepatosplenomegaly or mass. Bowel sounds normally active.  RECTAL EXAM:  Done by ER physician shows no masses and heme negative stool.  EXTREMITIES:  No pedal edema. No calf tenderness. Homans sign negative.  Pedal pulses were faint but present.  NEUROLOGICAL:  Alert and oriented. His speech was clear. The patient has  minimal  left hemiparesis involving the left arm and the left leg. DVTs were  hyperreactive on the left as opposed to the right. Babinski's downgoing on  the right and equivocal on the left. There was no facial asymmetry.   LABORATORY DATA:  CBC reveals a hemoglobin of 14.8, white count 10,200. CMET  shows a sodium of 136, potassium 3.6, glucose 126, BUN 17, creatinine 1.1.  Liver function tests were normal. CPK-MB was 63 with a MB of 3.6. Troponin  was 0.02. EKG showed no acute changes but an old inferior MI.   IMPRESSION:  1. Epigastric/chest pain, possibly due to cardiac ischemia although no     evidence of it right now. More likely to be due to GI causes such as     reflux, ulcer disease, gastritis, or gallstone.  2. Fever, possibly due to viral syndrome, pneumonia, cholecystitis, or     urinary tract infection.  3. History of ischemic heart disease status post coronary artery bypass     grafting.  4. Right brain cerebral vascular accident.  5. Hypertension, fairly severe right now.  6. Hyperlipidemia.  7. History of a transient ischemic attack.   PLAN:  1. Serial CPK-MBs and troponins.  2. EKG in the morning.  3. Upper abdominal ultrasound.  4. Provide a urine culture.  5. IV Rocephin and p.o. Zithromax.                                               Reuben Likes, M.D.    DCK/MEDQ  D:  06/19/2002  T:  06/22/2002  Job:  [id number]
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
## 2011-03-26 ENCOUNTER — Other Ambulatory Visit: Payer: Self-pay | Admitting: Internal Medicine

## 2011-04-23 ENCOUNTER — Other Ambulatory Visit: Payer: Self-pay | Admitting: Internal Medicine

## 2011-05-02 ENCOUNTER — Encounter: Payer: Self-pay | Admitting: Internal Medicine

## 2011-05-02 ENCOUNTER — Ambulatory Visit (INDEPENDENT_AMBULATORY_CARE_PROVIDER_SITE_OTHER): Payer: Medicare Other | Admitting: Internal Medicine

## 2011-05-02 DIAGNOSIS — I10 Essential (primary) hypertension: Secondary | ICD-10-CM

## 2011-05-02 DIAGNOSIS — F22 Delusional disorders: Secondary | ICD-10-CM

## 2011-05-02 DIAGNOSIS — I679 Cerebrovascular disease, unspecified: Secondary | ICD-10-CM

## 2011-05-02 DIAGNOSIS — F0151 Vascular dementia with behavioral disturbance: Secondary | ICD-10-CM

## 2011-05-02 DIAGNOSIS — E785 Hyperlipidemia, unspecified: Secondary | ICD-10-CM

## 2011-05-02 MED ORDER — AMLODIPINE BESYLATE 10 MG PO TABS: 10.0000 mg | ORAL_TABLET | Freq: Every day | ORAL | Status: DC

## 2011-05-02 MED ORDER — MIRTAZAPINE 15 MG PO TABS: 15.0000 mg | ORAL_TABLET | Freq: Every day | ORAL | Status: DC

## 2011-05-02 MED ORDER — HYDRALAZINE HCL 50 MG PO TABS: 50.0000 mg | ORAL_TABLET | Freq: Three times a day (TID) | ORAL | Status: DC

## 2011-05-02 MED ORDER — PAROXETINE HCL 20 MG PO TABS: 20.0000 mg | ORAL_TABLET | Freq: Every day | ORAL | Status: DC

## 2011-05-02 MED ORDER — DIVALPROEX SODIUM 250 MG PO DR TAB: 250.0000 mg | DELAYED_RELEASE_TABLET | Freq: Three times a day (TID) | ORAL | Status: DC

## 2011-05-02 MED ORDER — LOVASTATIN 40 MG PO TABS: 40.0000 mg | ORAL_TABLET | Freq: Every day | ORAL | Status: DC

## 2011-05-02 MED ORDER — TAMSULOSIN HCL 0.4 MG PO CAPS: 0.4000 mg | ORAL_CAPSULE | Freq: Every day | ORAL | Status: DC

## 2011-05-02 MED ORDER — ESOMEPRAZOLE MAGNESIUM 40 MG PO CPDR: 40.0000 mg | DELAYED_RELEASE_CAPSULE | Freq: Every day | ORAL | Status: DC

## 2011-05-02 MED ORDER — LISINOPRIL 40 MG PO TABS: 40.0000 mg | ORAL_TABLET | Freq: Every day | ORAL | Status: DC
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
## 2011-05-02 NOTE — Assessment & Plan Note (Signed)
On statin - The current medical regimen is effective;  continue present plan and medications.  Lab Results  Component Value Date   LDLCALC 67 11/01/2010

## 2011-05-02 NOTE — Assessment & Plan Note (Signed)
Hx CVA '99 - residual L HP LE>UE Last carotids done 2010 - recheck now Cont med mgmt - ASA, statin and BP control
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
## 2011-05-02 NOTE — Patient Instructions (Signed)
It was good to see you today. Medications reviewed, no changes at this time. Refill on medication(s) as discussed today. We have reviewed your prior records including labs and tests today we'll make referral for carotids . Our office will contact you regarding appointment(s) once made. Please schedule followup in 3-4 months, call sooner if problems.

## 2011-05-02 NOTE — Assessment & Plan Note (Signed)
The current medical regimen is effective;  continue present plan and medications.  BP Readings from Last 3 Encounters:  05/02/11 110/52  01/30/11 126/54  11/01/10 120/60

## 2011-05-17 ENCOUNTER — Other Ambulatory Visit: Payer: Self-pay | Admitting: Cardiology

## 2011-05-17 DIAGNOSIS — G459 Transient cerebral ischemic attack, unspecified: Secondary | ICD-10-CM

## 2011-05-20 ENCOUNTER — Encounter (INDEPENDENT_AMBULATORY_CARE_PROVIDER_SITE_OTHER): Payer: Medicare Other | Admitting: *Deleted

## 2011-05-20 DIAGNOSIS — G459 Transient cerebral ischemic attack, unspecified: Secondary | ICD-10-CM

## 2011-05-20 DIAGNOSIS — I6529 Occlusion and stenosis of unspecified carotid artery: Secondary | ICD-10-CM

## 2011-05-29 ENCOUNTER — Other Ambulatory Visit: Payer: Self-pay | Admitting: Internal Medicine

## 2011-05-29 ENCOUNTER — Other Ambulatory Visit: Payer: Self-pay | Admitting: *Deleted

## 2011-05-29 MED ORDER — HYDRALAZINE HCL 50 MG PO TABS: 50.0000 mg | ORAL_TABLET | Freq: Four times a day (QID) | ORAL | Status: DC

## 2011-05-29 NOTE — Telephone Encounter (Signed)
Received fax stating pt has been taking hydralazine 50 mg 1 tab QID, received script 05/02/11 for TID. Wanting to know correct dosage pls

## 2011-05-29 NOTE — Telephone Encounter (Signed)
Faxed info back to pharmacy QID. MD updated EPIC,,,05/29/11@1 :25pm/LMB

## 2011-05-29 NOTE — Telephone Encounter (Signed)
If taking QID with good control, ok to continue QID - i will change our med list from tid to qid - thanks

## 2011-06-20 ENCOUNTER — Other Ambulatory Visit: Payer: Self-pay | Admitting: Internal Medicine

## 2011-07-10 ENCOUNTER — Other Ambulatory Visit: Payer: Self-pay | Admitting: Internal Medicine

## 2011-08-02 ENCOUNTER — Encounter: Payer: Self-pay | Admitting: Internal Medicine

## 2011-08-02 ENCOUNTER — Ambulatory Visit (INDEPENDENT_AMBULATORY_CARE_PROVIDER_SITE_OTHER): Payer: Medicare Other | Admitting: Internal Medicine

## 2011-08-02 VITALS — BP 118/52 | HR 66 | Temp 98.3°F | Wt 159.8 lb

## 2011-08-02 DIAGNOSIS — M545 Low back pain, unspecified: Secondary | ICD-10-CM

## 2011-08-02 DIAGNOSIS — I10 Essential (primary) hypertension: Secondary | ICD-10-CM

## 2011-08-02 DIAGNOSIS — Z23 Encounter for immunization: Secondary | ICD-10-CM

## 2011-08-02 DIAGNOSIS — F0151 Vascular dementia with behavioral disturbance: Secondary | ICD-10-CM

## 2011-08-02 DIAGNOSIS — F0152 Vascular dementia, unspecified severity, with psychotic disturbance: Secondary | ICD-10-CM

## 2011-08-02 DIAGNOSIS — F22 Delusional disorders: Secondary | ICD-10-CM

## 2011-08-02 DIAGNOSIS — I509 Heart failure, unspecified: Secondary | ICD-10-CM

## 2011-08-02 MED ORDER — METHOCARBAMOL 500 MG PO TABS: 500.0000 mg | ORAL_TABLET | Freq: Three times a day (TID) | ORAL | Status: DC | PRN
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
## 2011-08-02 NOTE — Assessment & Plan Note (Signed)
dtr notes progressive DOE - no edema or evidence for vol overload -  Reviewed last echo 2010 - Recheck 2 d echo now - The current medical regimen is effective;  continue present plan and medications.

## 2011-08-02 NOTE — Assessment & Plan Note (Signed)
The current medical regimen is effective;  continue present plan and medications.  BP Readings from Last 3 Encounters:  08/02/11 118/52  05/02/11 110/52  01/30/11 126/54
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
## 2011-08-02 NOTE — Patient Instructions (Signed)
It was good to see you today. Medications reviewed, no changes at this time. Refill on medication(s) as discussed today. We have reviewed your prior records including labs and tests today we'll make referral for echocardiogram. Our office will contact you regarding appointment(s) once made. Please schedule followup in 4 months, call sooner if problems.

## 2011-08-02 NOTE — Assessment & Plan Note (Signed)
Chronic symptoms - at baseline Refill on robaxin - continue same mgmt

## 2011-08-13 ENCOUNTER — Ambulatory Visit (HOSPITAL_COMMUNITY): Payer: Medicare Other | Attending: Cardiovascular Disease | Admitting: Radiology

## 2011-08-13 DIAGNOSIS — J4489 Other specified chronic obstructive pulmonary disease: Secondary | ICD-10-CM | POA: Insufficient documentation

## 2011-08-13 DIAGNOSIS — I509 Heart failure, unspecified: Secondary | ICD-10-CM

## 2011-08-13 DIAGNOSIS — I079 Rheumatic tricuspid valve disease, unspecified: Secondary | ICD-10-CM | POA: Insufficient documentation

## 2011-08-13 DIAGNOSIS — E785 Hyperlipidemia, unspecified: Secondary | ICD-10-CM | POA: Insufficient documentation

## 2011-08-13 DIAGNOSIS — F0390 Unspecified dementia without behavioral disturbance: Secondary | ICD-10-CM | POA: Insufficient documentation

## 2011-08-13 DIAGNOSIS — Z8673 Personal history of transient ischemic attack (TIA), and cerebral infarction without residual deficits: Secondary | ICD-10-CM | POA: Insufficient documentation

## 2011-08-13 DIAGNOSIS — J449 Chronic obstructive pulmonary disease, unspecified: Secondary | ICD-10-CM | POA: Insufficient documentation

## 2011-08-13 DIAGNOSIS — I10 Essential (primary) hypertension: Secondary | ICD-10-CM | POA: Insufficient documentation

## 2011-10-21 ENCOUNTER — Other Ambulatory Visit: Payer: Self-pay | Admitting: Internal Medicine

## 2011-11-21 ENCOUNTER — Other Ambulatory Visit: Payer: Self-pay | Admitting: Internal Medicine

## 2011-12-06 ENCOUNTER — Ambulatory Visit (INDEPENDENT_AMBULATORY_CARE_PROVIDER_SITE_OTHER): Payer: Medicare Other | Admitting: Internal Medicine

## 2011-12-06 ENCOUNTER — Encounter: Payer: Self-pay | Admitting: Internal Medicine

## 2011-12-06 ENCOUNTER — Other Ambulatory Visit (INDEPENDENT_AMBULATORY_CARE_PROVIDER_SITE_OTHER): Payer: Medicare Other

## 2011-12-06 VITALS — BP 130/62 | HR 66 | Temp 97.1°F | Ht 67.0 in | Wt 164.8 lb

## 2011-12-06 DIAGNOSIS — R0602 Shortness of breath: Secondary | ICD-10-CM

## 2011-12-06 DIAGNOSIS — I5032 Chronic diastolic (congestive) heart failure: Secondary | ICD-10-CM

## 2011-12-06 DIAGNOSIS — E785 Hyperlipidemia, unspecified: Secondary | ICD-10-CM

## 2011-12-06 DIAGNOSIS — I509 Heart failure, unspecified: Secondary | ICD-10-CM

## 2011-12-06 LAB — CBC WITH DIFFERENTIAL/PLATELET
Basophils Relative: 1 % (ref 0.0–3.0)
Eosinophils Absolute: 1 10*3/uL — ABNORMAL HIGH (ref 0.0–0.7)
Lymphs Abs: 1.9 10*3/uL (ref 0.7–4.0)
MCHC: 32.7 g/dL (ref 30.0–36.0)
MCV: 89.9 fl (ref 78.0–100.0)
Monocytes Absolute: 0.7 10*3/uL (ref 0.1–1.0)
Neutrophils Relative %: 43.8 % (ref 43.0–77.0)
Platelets: 205 10*3/uL (ref 150.0–400.0)
RBC: 4.5 Mil/uL (ref 4.22–5.81)

## 2011-12-06 LAB — HEPATIC FUNCTION PANEL
Alkaline Phosphatase: 60 U/L (ref 39–117)
Bilirubin, Direct: 0.1 mg/dL (ref 0.0–0.3)
Total Bilirubin: 0.1 mg/dL — ABNORMAL LOW (ref 0.3–1.2)

## 2011-12-06 LAB — BASIC METABOLIC PANEL
BUN: 17 mg/dL (ref 6–23)
CO2: 24 mEq/L (ref 19–32)
Chloride: 106 mEq/L (ref 96–112)
Creatinine, Ser: 1.4 mg/dL (ref 0.4–1.5)

## 2011-12-06 LAB — LIPID PANEL
LDL Cholesterol: 75 mg/dL (ref 0–99)
Total CHOL/HDL Ratio: 3

## 2011-12-06 MED ORDER — ESOMEPRAZOLE MAGNESIUM 40 MG PO CPDR: 40.0000 mg | DELAYED_RELEASE_CAPSULE | Freq: Every day | ORAL | Status: DC

## 2011-12-06 MED ORDER — FUROSEMIDE 20 MG PO TABS: 20.0000 mg | ORAL_TABLET | Freq: Every day | ORAL | Status: DC

## 2011-12-06 MED ORDER — METHOCARBAMOL 500 MG PO TABS: 500.0000 mg | ORAL_TABLET | Freq: Three times a day (TID) | ORAL | Status: DC | PRN

## 2011-12-06 MED ORDER — HYDRALAZINE HCL 50 MG PO TABS: 50.0000 mg | ORAL_TABLET | Freq: Four times a day (QID) | ORAL | Status: DC

## 2011-12-06 MED ORDER — AMLODIPINE BESYLATE 10 MG PO TABS: 10.0000 mg | ORAL_TABLET | Freq: Every day | ORAL | Status: DC

## 2011-12-06 NOTE — Assessment & Plan Note (Signed)
Suspect multifactorial - mild diast dysfx on echo 11/12, normal LVEF Recheck CXR - hx COPD, s/p partial lung resection from prior lung ca check labs

## 2011-12-06 NOTE — Assessment & Plan Note (Signed)
progressive DOE - multifactorial  Reviewed last echo 07/2011 - Start low dose diuretic now Other eval as above for other causes of dyspnea - continue BP control and ASA qd The current medical regimen is effective;  continue present plan and medications.
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
## 2011-12-06 NOTE — Assessment & Plan Note (Signed)
On statin - check annually The current medical regimen is effective;  continue present plan and medications.  Lab Results  Component Value Date   LDLCALC 67 11/01/2010

## 2011-12-06 NOTE — Patient Instructions (Signed)
It was good to see you today. Medications reviewed, start low dose lasix each AM -no other changes at this time.  Your prescription(s) have been submitted to your pharmacy. Please take as directed and contact our office if you believe you are having problem(s) with the medication(s). Refill on medication(s) as discussed today. Test(s) ordered today. Your results will be called to you after review (48-72hours after test completion). If any changes need to be made, you will be notified at that time. Please schedule followup in 3-4 months, call sooner if problems.

## 2011-12-09 ENCOUNTER — Telehealth: Payer: Self-pay | Admitting: Internal Medicine

## 2011-12-09 MED ORDER — AMOXICILLIN-POT CLAVULANATE 875-125 MG PO TABS: 1.0000 | ORAL_TABLET | Freq: Two times a day (BID) | ORAL | Status: AC

## 2011-12-09 NOTE — Telephone Encounter (Signed)
done

## 2011-12-09 NOTE — Telephone Encounter (Signed)
Daughter  Requesting sinus prescription sent in to [facility].  Pt was seen by Dr Felicity Coyer this past Friday--Dau ph# [phone]

## 2011-12-09 NOTE — Telephone Encounter (Signed)
Notified daughter med sent to pharmacy... 12/09/11@10 :12am/LMB

## 2011-12-10 ENCOUNTER — Ambulatory Visit (INDEPENDENT_AMBULATORY_CARE_PROVIDER_SITE_OTHER)
Admission: RE | Admit: 2011-12-10 | Discharge: 2011-12-10 | Disposition: A | Discharge status: Home / Self Care | Payer: Medicare Other | Source: Ambulatory Visit | Attending: Internal Medicine | Admitting: Internal Medicine

## 2011-12-10 DIAGNOSIS — R0602 Shortness of breath: Secondary | ICD-10-CM

## 2011-12-10 DIAGNOSIS — I5032 Chronic diastolic (congestive) heart failure: Secondary | ICD-10-CM

## 2011-12-10 DIAGNOSIS — I509 Heart failure, unspecified: Secondary | ICD-10-CM

## 2011-12-24 ENCOUNTER — Other Ambulatory Visit: Payer: Self-pay | Admitting: Internal Medicine

## 2012-03-02 ENCOUNTER — Other Ambulatory Visit (INDEPENDENT_AMBULATORY_CARE_PROVIDER_SITE_OTHER): Payer: Medicare Other

## 2012-03-02 ENCOUNTER — Ambulatory Visit (INDEPENDENT_AMBULATORY_CARE_PROVIDER_SITE_OTHER): Payer: Medicare Other | Admitting: Internal Medicine

## 2012-03-02 ENCOUNTER — Encounter: Payer: Self-pay | Admitting: Internal Medicine

## 2012-03-02 VITALS — BP 140/62 | HR 62 | Temp 97.3°F | Ht 67.0 in | Wt 164.0 lb

## 2012-03-02 DIAGNOSIS — E785 Hyperlipidemia, unspecified: Secondary | ICD-10-CM

## 2012-03-02 DIAGNOSIS — R300 Dysuria: Secondary | ICD-10-CM

## 2012-03-02 DIAGNOSIS — I10 Essential (primary) hypertension: Secondary | ICD-10-CM

## 2012-03-02 DIAGNOSIS — F0151 Vascular dementia with behavioral disturbance: Secondary | ICD-10-CM

## 2012-03-02 DIAGNOSIS — I5032 Chronic diastolic (congestive) heart failure: Secondary | ICD-10-CM

## 2012-03-02 LAB — URINALYSIS, ROUTINE W REFLEX MICROSCOPIC
Bilirubin Urine: NEGATIVE
Nitrite: NEGATIVE
Specific Gravity, Urine: 1.01 (ref 1.000–1.030)
Total Protein, Urine: NEGATIVE
pH: 6 (ref 5.0–8.0)

## 2012-03-02 MED ORDER — DIVALPROEX SODIUM 250 MG PO DR TAB: 250.0000 mg | DELAYED_RELEASE_TABLET | Freq: Three times a day (TID) | ORAL | Status: AC
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
## 2012-03-02 NOTE — Progress Notes (Signed)
Subjective:    Patient ID: Erik FRIEDEN, male    DOB: 1941/01/16, 71 y.o.   MRN: [id number]  HPI  here for followup - reviewed chronic medical issues::  Dementia -on remeron and depakote - continued fluctations in eratic behavior - dtr's letter stating concerns was scanned into EMR 10/2010 OV unable to complete cog eval 07/12/10 with [facility] due to lack of participation with testing 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 denies adverse side effects related to current therapy.  no chest pain or dizziness  mood do -reports compliance with ongoing medical treatment and no changes in medication dose or frequency. denies adverse side effects related to current therapy. less irritable per family, no loss of appetite and less onging weight loss -  denies sadness.  dyslipidemia - reports compliance with ongoing medical treatment and no changes in medication dose or frequency. denies adverse side effects related to current therapy. no GI or muscle problems  hx lung ca surg - saw [facility]on again early 2011 for same because of inc pain at surgical hernia (no nausea, vomiting or inablilty to reduce) - no problems indetified on exam or CT - chronic dyspnea on exertion without change    Past Medical History  Diagnosis Date  . Hypertension   . COPD (chronic obstructive pulmonary disease)   . BPH (benign prostatic hyperplasia)   . Hyperlipidemia   . Dementia, vascular, with delusions   . Mood disorder   . 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 CAD (coronary artery disease)     CABG  . AAA (abdominal aortic aneurysm)   . GERD (gastroesophageal reflux disease)   . Lung cancer 2008    R lung resection     Review of Systems  Constitutional: Negative for fever and unexpected weight change.  Respiratory: Positive for shortness of breath (chronic). Negative for cough.   Genitourinary:  Positive for dysuria and frequency (on diuretic).  Musculoskeletal: Negative for back pain.  Neurological: Negative for seizures, syncope and headaches.         Objective:   Physical Exam  BP 140/62  Pulse 62  Temp(Src) 97.3 F (36.3 C) (Oral)  Ht 5\' 7"  (1.702 m)  Wt 164 lb (74.39 kg)  BMI 25.69 kg/m2  SpO2 95% Wt Readings from Last 3 Encounters:  03/02/12 164 lb (74.39 kg)  12/06/11 164 lb 12.8 oz (74.753 kg)  08/02/11 159 lb 12.8 oz (72.485 kg)   Constitutional: appears well-developed and well-nourished. No distress.  Dtr at side Neck: Normal range of motion. Neck supple. No JVD present. No thyromegaly present.  Cardiovascular: Normal rate, regular rhythm and normal heart sounds.  No murmur heard. trace distal pitting BLE edema Pulmonary/Chest: Effort normal and breath sounds normal. No respiratory distress. no wheezes.  Neurological: he is alert and oriented to person, place, and time. No cranial nerve deficit. Coordination normal. chronic LHP L>UE, walks with cane asst Psychiatric: he has a eccentric expansive mood and affect. Tangential though - hyperactive behavior. Judgement impaired.  Lab Results  Component Value Date   WBC 6.6 12/06/2011   HGB 13.2 12/06/2011   HCT 40.4 12/06/2011   PLT 205.0 12/06/2011   GLUCOSE 102* 12/06/2011   CHOL 132 12/06/2011   TRIG 78.0 12/06/2011   HDL 41.40 12/06/2011   LDLCALC 75 12/06/2011   ALT 18 12/06/2011   AST 18 12/06/2011   NA  139 12/06/2011   K 4.6 12/06/2011   CL 106 12/06/2011   CREATININE 1.4 12/06/2011   BUN 17 12/06/2011   CO2 24 12/06/2011   TSH 2.15 12/06/2011   HGBA1C  Value: 6.0 (NOTE)   The ADA recommends the following therapeutic goal for glycemic   control related to Hgb A1C measurement:   Goal of Therapy:   < 7.0% Hgb A1C   Reference: American Diabetes Association: Clinical Practice   Recommendations 2008, Diabetes Care,  2008, 31:(Suppl 1). 10/05/2008    MRI brain 10/06/2008 reviewed - no acute, no mass - chronic vasc  changes  2D echo 08/13/11: Study Conclusions  - Left ventricle: The cavity size was normal. Wall thickness   was increased in a pattern of mild LVH. Systolic function   was normal. The estimated ejection fraction was in the   range of 55% to 60%. Wall motion was normal; there were no   regional wall motion abnormalities. Doppler parameters are   consistent with abnormal left ventricular relaxation   (grade 1 diastolic dysfunction). - Pulmonary arteries: Systolic pressure was mildly   increased.   Assessment & Plan:  See problem list. Medications and labs reviewed today.  Dysuria - check UA

## 2012-03-02 NOTE — Assessment & Plan Note (Signed)
The current medical regimen is effective;  continue present plan and medications.  BP Readings from Last 3 Encounters:  03/02/12 140/62  12/06/11 130/62  08/02/11 118/52

## 2012-03-02 NOTE — Patient Instructions (Addendum)
It was good to see you today. Medications reviewed -no changes at this time.  Refill on medication(s) as discussed today. Urine test(s) ordered today. Your results will be called to you after review (48-72hours after test completion). If any changes need to be made, you will be notified at that time. Please schedule followup in 4 months, call sooner if problems.

## 2012-03-02 NOTE — Assessment & Plan Note (Signed)
On statin - check annually The current medical regimen is effective;  continue present plan and medications.  Lab Results  Component Value Date   LDLCALC 75 12/06/2011

## 2012-03-02 NOTE — Assessment & Plan Note (Signed)
progressive DOE - multifactorial  Reviewed last echo 07/2011 - Started low dose diuretic 11/3011 - variable compliance due to incontinence The current medical regimen is effective;  continue present plan and medications.  continue BP control and ASA qd The current medical regimen is effective;  continue present plan and medications.

## 2012-03-06 ENCOUNTER — Ambulatory Visit: Payer: Medicare Other | Admitting: Internal Medicine

## 2012-03-23 ENCOUNTER — Other Ambulatory Visit: Payer: Self-pay | Admitting: *Deleted

## 2012-03-23 MED ORDER — MIRTAZAPINE 15 MG PO TABS: 15.0000 mg | ORAL_TABLET | Freq: Every day | ORAL | Status: DC

## 2012-03-23 MED ORDER — TAMSULOSIN HCL 0.4 MG PO CAPS: 0.4000 mg | ORAL_CAPSULE | Freq: Every day | ORAL | Status: DC

## 2012-04-02 ENCOUNTER — Encounter: Payer: Self-pay | Admitting: Internal Medicine

## 2012-04-02 ENCOUNTER — Ambulatory Visit (INDEPENDENT_AMBULATORY_CARE_PROVIDER_SITE_OTHER): Payer: Medicare Other | Admitting: Internal Medicine

## 2012-04-02 VITALS — BP 136/60 | HR 58 | Temp 98.1°F | Ht 67.0 in | Wt 160.0 lb

## 2012-04-02 DIAGNOSIS — I209 Angina pectoris, unspecified: Secondary | ICD-10-CM

## 2012-04-02 DIAGNOSIS — R0989 Other specified symptoms and signs involving the circulatory and respiratory systems: Secondary | ICD-10-CM

## 2012-04-02 DIAGNOSIS — I2510 Atherosclerotic heart disease of native coronary artery without angina pectoris: Secondary | ICD-10-CM

## 2012-04-02 DIAGNOSIS — R0789 Other chest pain: Secondary | ICD-10-CM

## 2012-04-02 DIAGNOSIS — I208 Other forms of angina pectoris: Secondary | ICD-10-CM

## 2012-04-02 DIAGNOSIS — R0600 Dyspnea, unspecified: Secondary | ICD-10-CM

## 2012-04-02 DIAGNOSIS — R0609 Other forms of dyspnea: Secondary | ICD-10-CM

## 2012-04-02 MED ORDER — LOVASTATIN 40 MG PO TABS: 40.0000 mg | ORAL_TABLET | Freq: Every day | ORAL | Status: DC

## 2012-04-02 MED ORDER — PAROXETINE HCL 20 MG PO TABS: 20.0000 mg | ORAL_TABLET | Freq: Every day | ORAL | Status: DC

## 2012-04-02 MED ORDER — LISINOPRIL 40 MG PO TABS: 40.0000 mg | ORAL_TABLET | Freq: Every day | ORAL | Status: DC

## 2012-04-02 MED ORDER — AMLODIPINE BESYLATE 10 MG PO TABS: 10.0000 mg | ORAL_TABLET | Freq: Every day | ORAL | Status: AC

## 2012-04-02 MED ORDER — HYDRALAZINE HCL 50 MG PO TABS: 50.0000 mg | ORAL_TABLET | Freq: Four times a day (QID) | ORAL | Status: AC

## 2012-04-02 MED ORDER — FUROSEMIDE 20 MG PO TABS: 20.0000 mg | ORAL_TABLET | Freq: Every day | ORAL | Status: DC

## 2012-04-02 MED ORDER — DONEPEZIL HCL 5 MG PO TABS: 5.0000 mg | ORAL_TABLET | Freq: Every day | ORAL | Status: DC

## 2012-04-02 NOTE — Patient Instructions (Signed)
It was good to see you today. we'll make referral for stress test today . Our office will contact you regarding appointment(s) once made. Massage your hernia gently if/when it "pokes out" and call 911/go to ER if unable to "push it back in" Medications reviewed, no changes at this time. Refill on medication(s) as discussed today.

## 2012-04-02 NOTE — Progress Notes (Signed)
Subjective:    Patient ID: Erik [name], male    DOB: 1940/11/18, 71 y.o.   MRN: [id number]  HPI complains of episode of severe shortness of breath and chest pain - associated with "swollen lump" over RUQ - known incisional hernia with prior eval 2011 by [facility] for same Massaged lump back into place over and lying still - shortness of breath and pain then resolved No recurrence since then but dtr concerned with this severe episode and progressive dyspnea on exertion -   also reviewed chronic medical issues::  Dementia -on remeron and depakote - continued fluctations in eratic behavior - dtr's letter stating concerns was scanned into EMR 10/2010 OV unable to complete cog eval 07/12/10 with [facility] due to lack of participation with testing 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 denies adverse side effects related to current therapy.  no chest pain or dizziness  mood do -reports compliance with ongoing medical treatment and no changes in medication dose or frequency. denies adverse side effects related to current therapy. less irritable per family, no loss of appetite and less onging weight loss - "i'm ready for the lord to take me" but denies sadness.  dyslipidemia - reports compliance with ongoing medical treatment and no changes in medication dose or frequency. denies adverse side effects related to current therapy. no GI or muscle problems  hx lung ca surg - saw [facility] again early 2011 for same because of inc pain at surgical hernia (no nausea, vomiting or inablilty to reduce) - no problems indetified on exam or CT    Past Medical History  Diagnosis Date  . Hypertension   . COPD (chronic obstructive pulmonary disease)   . BPH (benign prostatic hyperplasia)   . Hyperlipidemia   . Dementia, vascular, with delusions   . Mood disorder   . 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 CAD (coronary artery disease)     CABG  . AAA (abdominal aortic aneurysm)   . GERD (gastroesophageal reflux disease)   . Lung cancer 2008    R lung resection     Review of Systems  Constitutional: Positive for activity change (fatigue with mild exertion, progressive x 17mo). Negative for fever and unexpected weight change.  Respiratory: Positive for shortness of breath. Negative for cough.   Cardiovascular: Positive for chest pain. Negative for palpitations and leg swelling.  Musculoskeletal: Negative for back pain.  Neurological: Negative for seizures, syncope and headaches.       Objective:   Physical Exam  BP 136/60  Pulse 58  Temp 98.1 F (36.7 C) (Oral)  Ht 5\' 7"  (1.702 m)  Wt 160 lb (72.576 kg)  BMI 25.06 kg/m2  SpO2 95% Wt Readings from Last 3 Encounters:  04/02/12 160 lb (72.576 kg)  03/02/12 164 lb (74.39 kg)  12/06/11 164 lb 12.8 oz (74.753 kg)   Constitutional: appears well-developed and well-nourished. No distress.  Dtr at side Neck: Normal range of motion. Neck supple. No JVD present. No thyromegaly present.  Cardiovascular: Normal rate, regular rhythm and normal heart sounds.  No murmur heard. no BLE edema Pulmonary/Chest: Effort normal and breath sounds normal. No respiratory distress. no wheezes.  Abdomen: soft, ?reducible small hernia over RUQ at side of prior thoracic surgical scar - nontender, +BS Neurological: he is alert and oriented to person, place, and time. No cranial nerve deficit. Coordination  normal. chronic LHP L>UE, walks with cane asst Psychiatric: he has a eccentric expansive mood and affect. Tangential though - hyperactive behavior. Judgement impaired.  Lab Results  Component Value Date   WBC 6.6 12/06/2011   HGB 13.2 12/06/2011   HCT 40.4 12/06/2011   PLT 205.0 12/06/2011   GLUCOSE 102* 12/06/2011   CHOL 132 12/06/2011   TRIG 78.0 12/06/2011   HDL 41.40 12/06/2011   LDLCALC 75 12/06/2011   ALT 18 12/06/2011   AST 18 12/06/2011   NA 139  12/06/2011   K 4.6 12/06/2011   CL 106 12/06/2011   CREATININE 1.4 12/06/2011   BUN 17 12/06/2011   CO2 24 12/06/2011   TSH 2.15 12/06/2011   HGBA1C  Value: 6.0 (NOTE)   The ADA recommends the following therapeutic goal for glycemic   control related to Hgb A1C measurement:   Goal of Therapy:   < 7.0% Hgb A1C   Reference: American Diabetes Association: Clinical Practice   Recommendations 2008, Diabetes Care,  2008, 31:(Suppl 1). 10/05/2008    MRI brain 10/06/2008 reviewed - no acute, no mass - chronic vasc changes  2D echo 08/13/11: Study Conclusions  - Left ventricle: The cavity size was normal. Wall thickness   was increased in a pattern of mild LVH. Systolic function   was normal. The estimated ejection fraction was in the   range of 55% to 60%. Wall motion was normal; there were no   regional wall motion abnormalities. Doppler parameters are   consistent with abnormal left ventricular relaxation   (grade 1 diastolic dysfunction). - Pulmonary arteries: Systolic pressure was mildly   increased.   Assessment & Plan:  Episodic shortness of breath - episode 48h ago reviewed - related to "hernia" protuberance RUQ and self reduction - no cough, no chest pain - Dtr also concerned with progressive fatigue and dyspnea on exertion -  Will check stress test for anginal equivalent given CAD/CABG hx  Also see problem list. Medications and labs reviewed today.

## 2012-04-02 NOTE — Assessment & Plan Note (Signed)
CABG 1999 Will refer for stress test due to suspected anginal equiv (see above)

## 2012-04-16 ENCOUNTER — Ambulatory Visit (HOSPITAL_COMMUNITY): Payer: Medicare Other | Attending: Internal Medicine | Admitting: Radiology

## 2012-04-16 VITALS — BP 104/61 | Ht 67.0 in | Wt 158.0 lb

## 2012-04-16 DIAGNOSIS — R0609 Other forms of dyspnea: Secondary | ICD-10-CM | POA: Insufficient documentation

## 2012-04-16 DIAGNOSIS — E785 Hyperlipidemia, unspecified: Secondary | ICD-10-CM | POA: Insufficient documentation

## 2012-04-16 DIAGNOSIS — I10 Essential (primary) hypertension: Secondary | ICD-10-CM | POA: Insufficient documentation

## 2012-04-16 DIAGNOSIS — R0789 Other chest pain: Secondary | ICD-10-CM

## 2012-04-16 DIAGNOSIS — R0602 Shortness of breath: Secondary | ICD-10-CM

## 2012-04-16 DIAGNOSIS — I2510 Atherosclerotic heart disease of native coronary artery without angina pectoris: Secondary | ICD-10-CM | POA: Insufficient documentation

## 2012-04-16 DIAGNOSIS — R079 Chest pain, unspecified: Secondary | ICD-10-CM | POA: Insufficient documentation

## 2012-04-16 DIAGNOSIS — I208 Other forms of angina pectoris: Secondary | ICD-10-CM

## 2012-04-16 DIAGNOSIS — R0989 Other specified symptoms and signs involving the circulatory and respiratory systems: Secondary | ICD-10-CM | POA: Insufficient documentation

## 2012-04-16 DIAGNOSIS — R0600 Dyspnea, unspecified: Secondary | ICD-10-CM

## 2012-04-16 DIAGNOSIS — I209 Angina pectoris, unspecified: Secondary | ICD-10-CM | POA: Insufficient documentation

## 2012-04-16 MED ORDER — TECHNETIUM TC 99M TETROFOSMIN IV KIT
33.0000 | PACK | Freq: Once | INTRAVENOUS | Status: AC | PRN
  Administered 2012-04-16: 33 via INTRAVENOUS

## 2012-04-16 MED ORDER — REGADENOSON 0.4 MG/5ML IV SOLN
0.4000 mg | Freq: Once | INTRAVENOUS | Status: AC
  Administered 2012-04-16: 0.4 mg via INTRAVENOUS

## 2012-04-16 MED ORDER — TECHNETIUM TC 99M TETROFOSMIN IV KIT
11.0000 | PACK | Freq: Once | INTRAVENOUS | Status: AC | PRN
  Administered 2012-04-16: 11 via INTRAVENOUS

## 2012-04-16 NOTE — Progress Notes (Signed)
[facility] SITE 3 NUCLEAR MED [address] [place] [address] [phone]  Cardiology Nuclear Med Study  Erik [name] is a 71 y.o. male     MRN : [id number]     DOB: 04/16/1941  Procedure Date: 04/16/2012  Nuclear Med Background Indication for Stress Test:  Evaluation for Ischemia and Graft Patency History:  COPD and '94 Angioplasty: LAD, '97 Stents: LAD, '99 Heart Cath: CABG, 2010 MI: EF: 70% NL 11/12 ECHO: EF: 55-60% mild LVH AAA Lung CA/Resection  Cardiac Risk Factors: Carotid Disease, CVA, History of Smoking, Hypertension, Lipids and TIA  Symptoms:  Chest Pain, DOE, Fatigue and Fatigue with Exertion   Nuclear Pre-Procedure Caffeine/Decaff Intake:  None NPO After: 630 PM   Lungs:  clear O2 Sat: 95% on room air. IV 0.9% NS with Angio Cath:  20g  IV Site: R Antecubital  IV Started by:  Bonnita Levan, RN  Chest Size (in):  46 Cup Size: n/a  Height: 5\' 7"  (1.702 m)  Weight:  158 lb (71.668 kg)  BMI:  Body mass index is 24.75 kg/(m^2). Tech Comments:  Patient took all meds except Lasix    Nuclear Med Study 1 or 2 day study: 1 day  Stress Test Type:  Eugenie Birks  Reading MD: Marca Ancona, MD  Order Authorizing Provider:  Rene Paci, MD  Resting Radionuclide: Technetium 74m Tetrofosmin  Resting Radionuclide Dose: 11.0 mCi   Stress Radionuclide:  Technetium 74m Tetrofosmin  Stress Radionuclide Dose: 33.0 mCi           Stress Protocol Rest HR: 58 Stress HR: 97  Rest BP: 104/61 Stress BP: 116/62  Exercise Time (min): n/a METS: n/a   Predicted Max HR: 147 bpm % Max HR: 65.99 bpm Rate Pressure Product: 21308   Dose of Adenosine (mg):  n/a Dose of Lexiscan: 0.4 mg  Dose of Atropine (mg): n/a Dose of Dobutamine: n/a mcg/kg/min (at max HR)  Stress Test Technologist: Milana Na, EMT-P  Nuclear Technologist:  Domenic Polite, CNMT     Rest Procedure:  Myocardial perfusion imaging was performed at rest 45 minutes following the intravenous  administration of Technetium 66m Tetrofosmin. Rest ECG: NSR - Normal EKG  Stress Procedure:  The patient received IV Lexiscan 0.4 mg over 15-seconds.  Technetium 25m Tetrofosmin injected at 30-seconds.  There were no significant changes, + sob, and his feet feel hot with Lexiscan.  Quantitative spect images were obtained after a 45 minute delay. Stress ECG: No significant change from baseline ECG  QPS Raw Data Images:  Normal; no motion artifact; normal heart/lung ratio. Stress Images:  Normal homogeneous uptake in all areas of the myocardium. Rest Images:  Normal homogeneous uptake in all areas of the myocardium. Subtraction (SDS):  There is no evidence of scar or ischemia. Transient Ischemic Dilatation (Normal <1.22):  1.15 Lung/Heart Ratio (Normal <0.45):  0.28  Quantitative Gated Spect Images QGS EDV:  54 ml QGS ESV:  12 ml  Impression Exercise Capacity:  Lexiscan with no exercise. BP Response:  Hypotensive blood pressure response. Clinical Symptoms:  Short of breath ECG Impression:  No significant ST segment change suggestive of ischemia. Comparison with Prior Nuclear Study: No images to compare  Overall Impression:  Normal stress nuclear study.  LV Ejection Fraction: 77%.  LV Wall Motion:  NL LV Function; NL Wall Motion  Marca Ancona 04/16/2012

## 2012-04-22 ENCOUNTER — Telehealth: Payer: Self-pay | Admitting: Internal Medicine

## 2012-04-22 ENCOUNTER — Inpatient Hospital Stay (HOSPITAL_COMMUNITY)
Admission: EM | Admit: 2012-04-22 | Discharge: 2012-04-25 | DRG: 683 | Disposition: A | Discharge status: Home Health | Payer: Medicare Other | Attending: Internal Medicine | Admitting: Internal Medicine

## 2012-04-22 ENCOUNTER — Emergency Department (HOSPITAL_COMMUNITY): Payer: Medicare Other

## 2012-04-22 ENCOUNTER — Encounter (HOSPITAL_COMMUNITY): Payer: Self-pay | Admitting: *Deleted

## 2012-04-22 DIAGNOSIS — E785 Hyperlipidemia, unspecified: Secondary | ICD-10-CM | POA: Diagnosis present

## 2012-04-22 DIAGNOSIS — I10 Essential (primary) hypertension: Secondary | ICD-10-CM | POA: Diagnosis present

## 2012-04-22 DIAGNOSIS — E875 Hyperkalemia: Secondary | ICD-10-CM | POA: Diagnosis present

## 2012-04-22 DIAGNOSIS — N179 Acute kidney failure, unspecified: Principal | ICD-10-CM | POA: Diagnosis present

## 2012-04-22 DIAGNOSIS — N401 Enlarged prostate with lower urinary tract symptoms: Secondary | ICD-10-CM

## 2012-04-22 DIAGNOSIS — R0602 Shortness of breath: Secondary | ICD-10-CM | POA: Diagnosis present

## 2012-04-22 DIAGNOSIS — R51 Headache: Secondary | ICD-10-CM

## 2012-04-22 DIAGNOSIS — E872 Acidosis, unspecified: Secondary | ICD-10-CM | POA: Diagnosis present

## 2012-04-22 DIAGNOSIS — R269 Unspecified abnormalities of gait and mobility: Secondary | ICD-10-CM

## 2012-04-22 DIAGNOSIS — J4489 Other specified chronic obstructive pulmonary disease: Secondary | ICD-10-CM | POA: Diagnosis present

## 2012-04-22 DIAGNOSIS — F0151 Vascular dementia with behavioral disturbance: Secondary | ICD-10-CM

## 2012-04-22 DIAGNOSIS — J449 Chronic obstructive pulmonary disease, unspecified: Secondary | ICD-10-CM | POA: Diagnosis present

## 2012-04-22 DIAGNOSIS — R29898 Other symptoms and signs involving the musculoskeletal system: Secondary | ICD-10-CM | POA: Diagnosis present

## 2012-04-22 DIAGNOSIS — F0390 Unspecified dementia without behavioral disturbance: Secondary | ICD-10-CM | POA: Diagnosis present

## 2012-04-22 DIAGNOSIS — N400 Enlarged prostate without lower urinary tract symptoms: Secondary | ICD-10-CM | POA: Diagnosis present

## 2012-04-22 DIAGNOSIS — J40 Bronchitis, not specified as acute or chronic: Secondary | ICD-10-CM | POA: Diagnosis present

## 2012-04-22 DIAGNOSIS — Z8679 Personal history of other diseases of the circulatory system: Secondary | ICD-10-CM

## 2012-04-22 DIAGNOSIS — R5381 Other malaise: Secondary | ICD-10-CM | POA: Diagnosis present

## 2012-04-22 DIAGNOSIS — I5032 Chronic diastolic (congestive) heart failure: Secondary | ICD-10-CM | POA: Diagnosis present

## 2012-04-22 DIAGNOSIS — K219 Gastro-esophageal reflux disease without esophagitis: Secondary | ICD-10-CM

## 2012-04-22 DIAGNOSIS — F430 Acute stress reaction: Secondary | ICD-10-CM | POA: Diagnosis present

## 2012-04-22 DIAGNOSIS — I69998 Other sequelae following unspecified cerebrovascular disease: Secondary | ICD-10-CM

## 2012-04-22 HISTORY — DX: Alzheimer's disease, unspecified: F02.80

## 2012-04-22 HISTORY — DX: Alzheimer's disease, unspecified: G30.9

## 2012-04-22 LAB — URINALYSIS, ROUTINE W REFLEX MICROSCOPIC
Glucose, UA: NEGATIVE mg/dL
Hgb urine dipstick: NEGATIVE
Leukocytes, UA: NEGATIVE
Protein, ur: NEGATIVE mg/dL
pH: 5 (ref 5.0–8.0)

## 2012-04-22 LAB — BASIC METABOLIC PANEL
BUN: 28 mg/dL — ABNORMAL HIGH (ref 6–23)
Chloride: 110 mEq/L (ref 96–112)
Creatinine, Ser: 1.57 mg/dL — ABNORMAL HIGH (ref 0.50–1.35)
Glucose, Bld: 98 mg/dL (ref 70–99)
Potassium: 7.2 mEq/L (ref 3.5–5.1)

## 2012-04-22 LAB — CARDIAC PANEL(CRET KIN+CKTOT+MB+TROPI)
CK, MB: 3.4 ng/mL (ref 0.3–4.0)
Total CK: 32 U/L (ref 7–232)
Troponin I: <0.3 ng/mL (ref <0.30)

## 2012-04-22 LAB — CBC
HCT: 40.1 % (ref 39.0–52.0)
Hemoglobin: 13.2 g/dL (ref 13.0–17.0)
MCHC: 32.9 g/dL (ref 30.0–36.0)
MCV: 90.7 fL (ref 78.0–100.0)
RDW: 14.4 % (ref 11.5–15.5)
WBC: 6.5 10*3/uL (ref 4.0–10.5)

## 2012-04-22 LAB — COMPREHENSIVE METABOLIC PANEL
AST: 28 U/L (ref 0–37)
Albumin: 3.3 g/dL — ABNORMAL LOW (ref 3.5–5.2)
Alkaline Phosphatase: 56 U/L (ref 39–117)
BUN: 24 mg/dL — ABNORMAL HIGH (ref 6–23)
CO2: 18 mEq/L — ABNORMAL LOW (ref 19–32)
Chloride: 103 mEq/L (ref 96–112)
Creatinine, Ser: 1.56 mg/dL — ABNORMAL HIGH (ref 0.50–1.35)
GFR calc non Af Amer: 42 mL/min — ABNORMAL LOW (ref >90)
Potassium: 5.3 mEq/L — ABNORMAL HIGH (ref 3.5–5.1)
Total Bilirubin: 0.1 mg/dL — ABNORMAL LOW (ref 0.3–1.2)

## 2012-04-22 LAB — POTASSIUM: Potassium: >7.5 mEq/L (ref 3.5–5.1)

## 2012-04-22 LAB — MRSA PCR SCREENING: MRSA by PCR: NEGATIVE

## 2012-04-22 LAB — BLOOD GAS, ARTERIAL
Bicarbonate: 16.4 mEq/L — ABNORMAL LOW (ref 20.0–24.0)
Drawn by: 307971
FIO2: 0.21 %
pCO2 arterial: 27.9 mmHg — ABNORMAL LOW (ref 35.0–45.0)
pH, Arterial: 7.386 (ref 7.350–7.450)
pO2, Arterial: 88 mmHg (ref 80.0–100.0)

## 2012-04-22 MED ORDER — METHOCARBAMOL 500 MG PO TABS: 500.0000 mg | ORAL_TABLET | Freq: Three times a day (TID) | ORAL | Status: DC | PRN

## 2012-04-22 MED ORDER — DONEPEZIL HCL 5 MG PO TABS
5.0000 mg | ORAL_TABLET | Freq: Every day | ORAL | Status: DC
  Administered 2012-04-22 – 2012-04-25 (×4): 5 mg via ORAL
  Filled 2012-04-22 (×4): qty 1

## 2012-04-22 MED ORDER — ONDANSETRON HCL 4 MG/2ML IJ SOLN: 4.0000 mg | Freq: Four times a day (QID) | INTRAMUSCULAR | Status: DC | PRN

## 2012-04-22 MED ORDER — ONDANSETRON HCL 4 MG PO TABS: 4.0000 mg | ORAL_TABLET | Freq: Four times a day (QID) | ORAL | Status: DC | PRN

## 2012-04-22 MED ORDER — SODIUM POLYSTYRENE SULFONATE 15 GM/60ML PO SUSP
15.0000 g | Freq: Once | ORAL | Status: AC
  Administered 2012-04-22: 15 g via ORAL
  Filled 2012-04-22: qty 60

## 2012-04-22 MED ORDER — DEXTROSE 50 % IV SOLN
1.0000 | Freq: Once | INTRAVENOUS | Status: AC
  Administered 2012-04-22: 50 mL via INTRAVENOUS
  Filled 2012-04-22: qty 50

## 2012-04-22 MED ORDER — PANTOPRAZOLE SODIUM 40 MG PO TBEC
40.0000 mg | DELAYED_RELEASE_TABLET | Freq: Every day | ORAL | Status: DC
  Administered 2012-04-23 – 2012-04-25 (×3): 40 mg via ORAL
  Filled 2012-04-22 (×4): qty 1

## 2012-04-22 MED ORDER — PAROXETINE HCL 20 MG PO TABS
20.0000 mg | ORAL_TABLET | Freq: Every day | ORAL | Status: DC
  Administered 2012-04-23 – 2012-04-25 (×3): 20 mg via ORAL
  Filled 2012-04-22 (×3): qty 1

## 2012-04-22 MED ORDER — ACETAMINOPHEN 650 MG RE SUPP: 650.0000 mg | Freq: Four times a day (QID) | RECTAL | Status: DC | PRN

## 2012-04-22 MED ORDER — SODIUM CHLORIDE 0.9 % IV BOLUS (SEPSIS)
500.0000 mL | Freq: Once | INTRAVENOUS | Status: AC
  Administered 2012-04-22: 500 mL via INTRAVENOUS

## 2012-04-22 MED ORDER — INSULIN ASPART 100 UNIT/ML ~~LOC~~ SOLN
SUBCUTANEOUS | Status: AC
  Administered 2012-04-22: 13:00:00 via SUBCUTANEOUS
  Filled 2012-04-22: qty 1

## 2012-04-22 MED ORDER — NITROGLYCERIN 0.4 MG/SPRAY TL SOLN
1.0000 | Status: DC | PRN
  Filled 2012-04-22: qty 4.9

## 2012-04-22 MED ORDER — TAMSULOSIN HCL 0.4 MG PO CAPS
0.4000 mg | ORAL_CAPSULE | Freq: Every day | ORAL | Status: DC
  Administered 2012-04-22 – 2012-04-25 (×4): 0.4 mg via ORAL
  Filled 2012-04-22 (×4): qty 1

## 2012-04-22 MED ORDER — SODIUM CHLORIDE 0.9 % IV SOLN
INTRAVENOUS | Status: DC
  Administered 2012-04-22: 10000 mL via INTRAVENOUS
  Administered 2012-04-23: 07:00:00 via INTRAVENOUS

## 2012-04-22 MED ORDER — ONDANSETRON HCL 4 MG/2ML IJ SOLN: 4.0000 mg | Freq: Three times a day (TID) | INTRAMUSCULAR | Status: DC | PRN

## 2012-04-22 MED ORDER — INSULIN ASPART 100 UNIT/ML IV SOLN
10.0000 [IU] | Freq: Once | INTRAVENOUS | Status: AC
  Administered 2012-04-22: 10 [IU] via INTRAVENOUS
  Filled 2012-04-22: qty 0.1

## 2012-04-22 MED ORDER — HYDRALAZINE HCL 50 MG PO TABS
50.0000 mg | ORAL_TABLET | Freq: Four times a day (QID) | ORAL | Status: DC
  Administered 2012-04-22 – 2012-04-25 (×11): 50 mg via ORAL
  Filled 2012-04-22 (×15): qty 1

## 2012-04-22 MED ORDER — AMLODIPINE BESYLATE 10 MG PO TABS
10.0000 mg | ORAL_TABLET | Freq: Every day | ORAL | Status: DC
  Administered 2012-04-22 – 2012-04-25 (×4): 10 mg via ORAL
  Filled 2012-04-22 (×4): qty 1

## 2012-04-22 MED ORDER — IPRATROPIUM-ALBUTEROL 18-103 MCG/ACT IN AERO: 2.0000 | INHALATION_SPRAY | Freq: Four times a day (QID) | RESPIRATORY_TRACT | Status: DC | PRN

## 2012-04-22 MED ORDER — ENOXAPARIN SODIUM 40 MG/0.4ML ~~LOC~~ SOLN
40.0000 mg | SUBCUTANEOUS | Status: DC
Start: 2012-04-22 — End: 2012-04-25
  Administered 2012-04-22 – 2012-04-24 (×3): 40 mg via SUBCUTANEOUS
  Filled 2012-04-22 (×5): qty 0.4

## 2012-04-22 MED ORDER — SODIUM CHLORIDE 0.9 % IV SOLN
Freq: Once | INTRAVENOUS | Status: AC
  Administered 2012-04-22: 14:00:00 via INTRAVENOUS

## 2012-04-22 MED ORDER — SIMVASTATIN 5 MG PO TABS
5.0000 mg | ORAL_TABLET | Freq: Every day | ORAL | Status: DC
  Administered 2012-04-22 – 2012-04-24 (×3): 5 mg via ORAL
  Filled 2012-04-22 (×5): qty 1

## 2012-04-22 MED ORDER — ALBUTEROL SULFATE (5 MG/ML) 0.5% IN NEBU
10.0000 mg | INHALATION_SOLUTION | Freq: Once | RESPIRATORY_TRACT | Status: AC
  Administered 2012-04-22: 10 mg via RESPIRATORY_TRACT
  Filled 2012-04-22: qty 2

## 2012-04-22 MED ORDER — IPRATROPIUM-ALBUTEROL 20-100 MCG/ACT IN AERS: 1.0000 | INHALATION_SPRAY | Freq: Four times a day (QID) | RESPIRATORY_TRACT | Status: DC | PRN

## 2012-04-22 MED ORDER — DIVALPROEX SODIUM 250 MG PO DR TAB
250.0000 mg | DELAYED_RELEASE_TABLET | Freq: Three times a day (TID) | ORAL | Status: DC
  Administered 2012-04-22 – 2012-04-25 (×9): 250 mg via ORAL
  Filled 2012-04-22 (×12): qty 1

## 2012-04-22 MED ORDER — SODIUM BICARBONATE 8.4 % IV SOLN
50.0000 meq | Freq: Once | INTRAVENOUS | Status: AC
  Administered 2012-04-22: 50 meq via INTRAVENOUS
  Filled 2012-04-22: qty 50

## 2012-04-22 MED ORDER — MIRTAZAPINE 15 MG PO TABS
15.0000 mg | ORAL_TABLET | Freq: Every day | ORAL | Status: DC
  Administered 2012-04-22 – 2012-04-24 (×3): 15 mg via ORAL
  Filled 2012-04-22 (×4): qty 1

## 2012-04-22 MED ORDER — ACETAMINOPHEN 325 MG PO TABS: 650.0000 mg | ORAL_TABLET | Freq: Four times a day (QID) | ORAL | Status: DC | PRN

## 2012-04-22 MED ORDER — OXYCODONE HCL 5 MG PO TABS: 5.0000 mg | ORAL_TABLET | Freq: Four times a day (QID) | ORAL | Status: DC | PRN

## 2012-04-22 MED ORDER — SODIUM CHLORIDE 0.9 % IV SOLN
1.0000 g | Freq: Once | INTRAVENOUS | Status: AC
  Administered 2012-04-22: 1 g via INTRAVENOUS
  Filled 2012-04-22: qty 10

## 2012-04-22 MED ORDER — SODIUM CHLORIDE 0.9 % IV SOLN: INTRAVENOUS | Status: DC

## 2012-04-22 MED ORDER — ASPIRIN EC 325 MG PO TBEC
325.0000 mg | DELAYED_RELEASE_TABLET | Freq: Every day | ORAL | Status: DC
  Administered 2012-04-22 – 2012-04-25 (×4): 325 mg via ORAL
  Filled 2012-04-22 (×4): qty 1

## 2012-04-22 NOTE — ED Provider Notes (Signed)
Medical screening examination/treatment/procedure(s) were conducted as a shared visit with non-physician practitioner(s) and myself.  I personally evaluated the patient during the encounter  Pt with hyperkalemia and metabolic acidosis.  Will recheck bmet to ensure this is not lab error but suspect this is true.  Will treat the hyperkalemia.  No EKG changes at this time.  Will plan on admission for further evaluation.  Celene Kras, MD 04/22/12 [phone]

## 2012-04-22 NOTE — Telephone Encounter (Signed)
Caller: Lisa/Child; PCP: Rene Paci; CB#: [phone]; ; ; Call regarding Difficulty Breathing; Pt.s daughter calling about pt. who is gurgling, and asleep.  Caller is not with the pt.  Callers brother just came to her house, and explained that he left the pt. asleep and gurgling.  Pt. instructed to hang up and call 911 to the pt.s address now.   Contacted daughter back to see if she called 911.  She had not.  She drove over to his house, and he was awake, stating he was not SOB without CP, but looked SOB to daughter, and could only speak 2 words at a time.  Caller instructed that this was still a 911 situation, and after calling 911, give pt. 325 mg. of ASA.  Caller agrees to do so.  Triaged with Breathing Problems, and Disposition:  911.  Care instructions given.

## 2012-04-22 NOTE — ED Notes (Signed)
Bed:WA14<BR> Expected date:<BR> Expected time:<BR> Means of arrival:<BR> Comments:<BR> EMS
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
## 2012-04-22 NOTE — Telephone Encounter (Signed)
Noted and agree thanks
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
## 2012-04-22 NOTE — ED Provider Notes (Signed)
Medical screening examination/treatment/procedure(s) were performed by non-physician practitioner and as supervising physician I was immediately available for consultation/collaboration.   Celene Kras, MD 04/22/12 [phone]

## 2012-04-22 NOTE — ED Notes (Signed)
Respiratory called to come draw ABG.
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
## 2012-04-22 NOTE — ED Notes (Signed)
Patient transported to X-ray. Will obtain labs when patient returns.

## 2012-04-23 ENCOUNTER — Inpatient Hospital Stay (HOSPITAL_COMMUNITY): Payer: Medicare Other

## 2012-04-23 DIAGNOSIS — K219 Gastro-esophageal reflux disease without esophagitis: Secondary | ICD-10-CM

## 2012-04-23 LAB — TSH: TSH: 2.082 u[IU]/mL (ref 0.350–4.500)

## 2012-04-23 LAB — CARDIAC PANEL(CRET KIN+CKTOT+MB+TROPI)
CK, MB: 3.2 ng/mL (ref 0.3–4.0)
Relative Index: INVALID (ref 0.0–2.5)
Total CK: 39 U/L (ref 7–232)
Total CK: 52 U/L (ref 7–232)

## 2012-04-23 LAB — CBC
MCV: 90.6 fL (ref 78.0–100.0)
Platelets: 159 10*3/uL (ref 150–400)
RBC: 3.5 MIL/uL — ABNORMAL LOW (ref 4.22–5.81)
RDW: 14.7 % (ref 11.5–15.5)
WBC: 6.1 10*3/uL (ref 4.0–10.5)

## 2012-04-23 LAB — BASIC METABOLIC PANEL
Calcium: 8.2 mg/dL — ABNORMAL LOW (ref 8.4–10.5)
Creatinine, Ser: 1.38 mg/dL — ABNORMAL HIGH (ref 0.50–1.35)
GFR calc Af Amer: 57 mL/min — ABNORMAL LOW (ref >90)
GFR calc non Af Amer: 49 mL/min — ABNORMAL LOW (ref >90)
Sodium: 135 mEq/L (ref 135–145)

## 2012-04-23 MED ORDER — SODIUM POLYSTYRENE SULFONATE 15 GM/60ML PO SUSP
15.0000 g | Freq: Once | ORAL | Status: AC
  Administered 2012-04-23: 15 g via ORAL
  Filled 2012-04-23: qty 60

## 2012-04-23 MED ORDER — SODIUM CHLORIDE 0.9 % IV SOLN
INTRAVENOUS | Status: AC
  Administered 2012-04-23 (×2): via INTRAVENOUS

## 2012-04-23 NOTE — Progress Notes (Signed)
Pt has been npo per radiology request for ultrasound.

## 2012-04-23 NOTE — Progress Notes (Signed)
[facility] PROGRESS NOTE  Erik [name] WUJ:[id number] DOB: 1941-03-01 DOA: 04/22/2012 PCP: Rene Paci, MD  Assessment/Plan: 1-SOB (shortness of breath): Resolved now. No source of infection identified. Chest x-ray has demonstrated no acute infiltrates. Patient is no febrile and at this point his increase work of breathing prior to admission was most likely physiologically response to his acute metabolic acidosis.   2-metabolic acidosis: Most likely secondary to acute renal failure in the settings of pre-renal azotemia due to dehydration (poor PO intake) and continue use of diuretics/ACE inhibitors. Will continue holding diuretics and ACE inhibitors; continue gentle hydration and follow BMET in am, to follow electrolytes and kidney function. Bicarb is 19 today  3-HYPERTENSION: Stable. Will continue the use of amlodipine and hydralazine.   4-Acute kidney injury: Secondary to decreased by mouth intake and continue use of diuretics/ACE inhibitors. Will continue fluid resuscitation; follow results of renal ultrasound and urine culture. Cr 1.38 today, improving.  5-Chronic diastolic heart failure: Stable and per physical exam patient is euvolemic. Will continue holding Lasix at this moment. Continue daily weight, heart healthy diet, close followup of his intake and output. CE'z negative X3.  6-COPD: No wheezing. Currently stable. Will use when necessary Combivent for shortness of breath/wheezing if he develops any.   7-Hyperkalemia: Do to acute renal failure. Improved, but still mildly elevate. Will repeat kayexalate 15G today and follow K levels in am.  8-Dementia: Continue supportive care. Continue donepezil.   9-BPH (benign prostatic hyperplasia): Continue Flomax.  10-HLD: continue statins.  11-GERD: continue PPI.  Code Status: Full Family Communication: No family members at bedside Disposition Plan: Per patient he will like to go back home; will ask Pt/OT to evaluate and  help with discharge disposition based on evaluation results.   Brief narrative: 71 year old male with a past medical history significant for hypertension, COPD, hyperlipidemia, dementia; history of a stroke in the past with residual deficit on his left upper and lower extremities; came into the hospital secondary to increased shortness of breath and weakness. Patient was recently evaluated by cardiology with a negative Myoview 1 week prior to admission (Dr. Morene Antu cardiology).  Consultants:  None  Procedures:  Renal US (Pending)  Antibiotics:  none  HPI/Subjective: Feeling a lot better; no CP or SOB; able to speak in full sentences. Afebrile  Objective: Filed Vitals:   04/23/12 0000 04/23/12 0400 04/23/12 0803 04/23/12 0900  BP: 102/52  142/59   Pulse: 60  61   Temp: 97.8 F (36.6 C) 97.9 F (36.6 C)  98 F (36.7 C)  TempSrc: Oral Oral  Oral  Resp: 18  18   Height:      Weight: 74.2 kg (163 lb 9.3 oz)     SpO2: 97%  99%     Intake/Output Summary (Last 24 hours) at 04/23/12 0910 Last data filed at 04/23/12 0800  Gross per 24 hour  Intake   2640 ml  Output   2475 ml  Net    165 ml    Exam:   General: NAD; Afebrile.  Cardiovascular: S1 and S2; no rubs or gallops  Respiratory: No wheezing and no rales  Abdomen: soft; NT/ND; +BS  Neurology: AAOX2; CN intact and no new focal deficit appreciated. LUE and LLE with 3-4/5 MS (chronic, residual from CVA)  Extremities: no cyanosis or clubbing; trace edema.  Data Reviewed: Basic Metabolic Panel:  Lab 04/23/12 9562 04/22/12 1925 04/22/12 1255 04/22/12 1147  NA 135 133* -- 135  K 5.5* 5.3* >7.5* 7.2*  CL 107 103 -- 110  CO2 19 18* -- 16*  GLUCOSE 93 147* -- 98  BUN 20 24* -- 28*  CREATININE 1.38* 1.56* -- 1.57*  CALCIUM 8.2* 8.8 -- 9.3  MG -- 1.7 -- --  PHOS -- 4.5 -- --   Liver Function Tests:  Lab 04/22/12 1925  AST 28  ALT 33  ALKPHOS 56  BILITOT 0.1*  PROT 6.0  ALBUMIN 3.3*    CBC:  Lab 04/23/12 0205 04/22/12 1147  WBC 6.1 6.5  NEUTROABS -- --  HGB 10.7* 13.2  HCT 31.7* 40.1  MCV 90.6 90.7  PLT 159 201   Cardiac Enzymes:  Lab 04/23/12 0205 04/22/12 1925 04/22/12 1147  CKTOTAL 39 32 --  CKMB 3.2 3.4 --  CKMBINDEX -- -- --  TROPONINI <0.30 <0.30 <0.30   BNP (last 3 results)  Basename 04/22/12 1147  PROBNP 463.3*     Recent Results (from the past 240 hour(s))  MRSA PCR SCREENING     Status: Normal   Collection Time   04/22/12  3:43 PM      Component Value Range Status Comment   MRSA by PCR NEGATIVE  NEGATIVE Final      Studies: Dg Chest 2 View  04/22/2012  *RADIOLOGY REPORT*  Clinical Data: Shortness of breath with exertion, history of lung cancer status post right upper lobectomy  CHEST - 2 VIEW  Comparison: Most recent prior chest x-ray 12/10/2011, most recent prior chest CT 10/23/2009  Findings: Surgical changes of prior median sternotomy and multivessel CABG including LIMA bypass.  Surgical clips are also noted in the right hilum consistent with prior right upper lobectomy.  There is hyperexpansion of the right lower, and middle lobes with chronic blunting of the right costophrenic angle.  The lungs are negative for edema, focal airspace consolidation, pleural effusion, or pneumothorax.  Cardiac and mediastinal contours are within normal limits. Surgical clips in the right upper quadrant suggest prior cholecystectomy.  Remote healed fracture of the right seventh rib, unchanged.  No acute osseous findings.  IMPRESSION: 1.  No acute cardiopulmonary disease. 2.  Surgical changes consistent with prior right upper lobectomy, median sternotomy and multivessel CABG.  Original Report Authenticated By: Vilma Prader    Scheduled Meds:   . sodium chloride   Intravenous Once  . albuterol  10 mg Nebulization Once  . amLODipine  10 mg Oral Daily  . aspirin  325 mg Oral Daily  . calcium gluconate 1 GM IV  1 g Intravenous Once  . dextrose  1 ampule Intravenous  Once  . divalproex  250 mg Oral TID  . donepezil  5 mg Oral Daily  . enoxaparin (LOVENOX) injection  40 mg Subcutaneous Q24H  . hydrALAZINE  50 mg Oral QID  . insulin aspart      . insulin aspart  10 Units Intravenous Once  . mirtazapine  15 mg Oral QHS  . pantoprazole  40 mg Oral Q1200  . PARoxetine  20 mg Oral Daily  . simvastatin  5 mg Oral q1800  . sodium bicarbonate  50 mEq Intravenous Once  . sodium chloride  500 mL Intravenous Once  . sodium polystyrene  15 g Oral Once  . sodium polystyrene  15 g Oral Once  . Tamsulosin HCl  0.4 mg Oral Daily  . DISCONTD: sodium chloride   Intravenous STAT   Continuous Infusions:   . sodium chloride    . DISCONTD: sodium chloride 75 mL/hr at 04/23/12 [phone]  Time spent: > 30 minutes    Ayani Ospina  [facility] Pager [phone]. If 8PM-8AM, please contact night-coverage at [email or web address], password [facility] 04/23/2012, 9:10 AM  LOS: 1 day

## 2012-04-24 DIAGNOSIS — R0602 Shortness of breath: Secondary | ICD-10-CM

## 2012-04-24 LAB — CBC
HCT: 35.7 % — ABNORMAL LOW (ref 39.0–52.0)
MCHC: 32.5 g/dL (ref 30.0–36.0)
RBC: 3.87 MIL/uL — ABNORMAL LOW (ref 4.22–5.81)
WBC: 7.4 10*3/uL (ref 4.0–10.5)

## 2012-04-24 LAB — URINE CULTURE: Colony Count: 60000

## 2012-04-24 LAB — BASIC METABOLIC PANEL
Calcium: 8.8 mg/dL (ref 8.4–10.5)
Creatinine, Ser: 1.37 mg/dL — ABNORMAL HIGH (ref 0.50–1.35)
GFR calc non Af Amer: 50 mL/min — ABNORMAL LOW (ref >90)
Glucose, Bld: 96 mg/dL (ref 70–99)
Sodium: 137 mEq/L (ref 135–145)

## 2012-04-24 NOTE — Evaluation (Signed)
Occupational Therapy Evaluation Patient Details Name: Erik [name] MRN: [id number] DOB: [birth year] Today's Date: 04/24/2012 Time: 4098-1191 OT Time Calculation (min): 47 min  OT Assessment / Plan / Recommendation Clinical Impression  71 year old male with a past medical history significant for hypertension, COPD, hyperlipidemia, dementia; history of a stroke in the past with residual deficit on his left upper and lower extremities; came into the hospital secondary to increased shortness of breath and weakness.Pt displays decreased strength, safety and independence with ADL. Will benefit from skilled OT services to imrpove safety and independence with all tasks.     OT Assessment  Patient needs continued OT Services    Follow Up Recommendations  Home health OT;Supervision/Assistance - 24 hour;Other (comment) (pt will need 24/7 care due to cogntive deficits.) Family aware of OT recommendation.    Barriers to Discharge      Equipment Recommendations  None recommended by OT    Recommendations for Other Services    Frequency  Min 2X/week    Precautions / Restrictions Precautions Precautions: Fall Required Braces or Orthoses: Other Brace/Splint Other Brace/Splint: L AFO -has worn since his stroke Restrictions Weight Bearing Restrictions: No        ADL  Eating/Feeding: Simulated;Independent Where Assessed - Eating/Feeding: Bed level Grooming: Simulated;Wash/dry hands;Set up Where Assessed - Grooming: Unsupported sitting Upper Body Bathing: Performed;Chest;Right arm;Left arm;Abdomen;Supervision/safety;Set up;Other (comment) (verbal cues for thoroughness) Where Assessed - Upper Body Bathing: Unsupported sitting Lower Body Bathing: Performed;Minimal assistance Where Assessed - Lower Body Bathing: Supported sit to stand Upper Body Dressing: Simulated;Set up;Supervision/safety Where Assessed - Upper Body Dressing: Unsupported sitting Lower Body Dressing: Simulated;Minimal  assistance;Other (comment) (simulated with socks and pants. did not don AFO) Where Assessed - Lower Body Dressing: Supported sit to Pharmacist, hospital: Performed;Minimal assistance;Other (comment) (with pt's cane) Toilet Transfer Method: Other (comment) (ambulating) Toilet Transfer Equipment: Comfort height toilet;Grab bars Toileting - Clothing Manipulation and Hygiene: Simulated;Minimal assistance Where Assessed - Engineer, mining and Hygiene: Sit to stand from 3-in-1 or toilet Tub/Shower Transfer Method: Not assessed Equipment Used: Cane ADL Comments: When OT arrived, pts gown saturated with urine as well as the bed. Catheter twisted and leaking and almost pulled off. Nsg made aware and nsg stated ok for catheter to come off and they will put on a new one. Family present for eval. Did not have pt don AFO this visit. Daughter states pt is ok to go short distance without it. Pt wanting to get into bathroom for bowel movement. Pt needs intermittent verbal cues for safety especially as he is impulsive at times. He also needs cues with bathing for thoroughness and soap use.     OT Diagnosis: Generalized weakness  OT Problem List: Decreased strength;Decreased cognition;Decreased coordination;Decreased knowledge of use of DME or AE OT Treatment Interventions: Self-care/ADL training;Therapeutic activities;DME and/or AE instruction;Patient/family education   OT Goals Acute Rehab OT Goals OT Goal Formulation: With patient/family Time For Goal Achievement: 05/08/12 Potential to Achieve Goals: Good ADL Goals Pt Will Perform Grooming: with supervision;Standing at sink ADL Goal: Grooming - Progress: Goal set today Pt Will Perform Lower Body Bathing: with supervision;Sit to stand from chair;Sit to stand from bed ADL Goal: Lower Body Bathing - Progress: Goal set today Pt Will Perform Lower Body Dressing: with supervision;Sit to stand from chair;Sit to stand from bed ADL Goal: Lower Body  Dressing - Progress: Goal set today Pt Will Transfer to Toilet: with supervision;Ambulation;Regular height toilet;Grab bars;Other (comment) (with cane) ADL Goal: Toilet Transfer - Progress: Goal set  today Pt Will Perform Toileting - Clothing Manipulation: with supervision;Standing ADL Goal: Toileting - Clothing Manipulation - Progress: Goal set today Pt Will Perform Tub/Shower Transfer: with supervision ADL Goal: Tub/Shower Transfer - Progress: Goal set today Additional ADL Goal #1: Pt will demonstrate good safety awareness with less than or equal to one verbal cue during an ADL task.  ADL Goal: Additional Goal #1 - Progress: Goal set today  Visit Information  Last OT Received On: 04/24/12 Assistance Needed: +1    Subjective Data  Subjective: can you turn this alarm off? (pt aware he is on bed alarm) Patient Stated Goal: none stated. agreeable up with OT   Prior Functioning  Vision/Perception  Home Living Lives With: Son Type of Home: House Home Access: Stairs to enter [employer] of Steps: 2 Entrance Stairs-Rails: Right;Left;Can reach both Home Layout: One level Bathroom Shower/Tub: Tub/shower unit;Other (comment) (grab bars) Bathroom Toilet: Standard (grab bar on left) Home Adaptive Equipment: Shower chair with back;Walker - rolling;Straight cane;Wheelchair - manual Prior Function Level of Independence: Independent with assistive device(s) (wears AFO on L LE) Driving: No Comments: pt has had a few falls per daughter and some recently Dominant Hand: Right      Cognition  Overall Cognitive Status: History of cognitive impairments - at baseline Arousal/Alertness: Awake/alert Orientation Level: Disoriented to;Time;Situation Behavior During Session: Restless Cognition - Other Comments: pt also impulsive at times.     Extremity/Trunk Assessment Right Upper Extremity Assessment RUE ROM/Strength/Tone: [facility] for tasks assessed Left Upper Extremity Assessment LUE  ROM/Strength/Tone: Deficits LUE ROM/Strength/Tone Deficits: approximately 140 degrees should flexion actively; PROM WFL. Elbow strength 4-/5. Grip fair +. Decreased motor control and note spontaneous movements.    Mobility Bed Mobility Bed Mobility: Supine to Sit Supine to Sit: 4: Min assist;HOB elevated Details for Bed Mobility Assistance: min verbal cues and assist for trunk to upright and facilitate scoot to EOB. Transfers Transfers: Stand to Sit;Sit to Stand Sit to Stand: 4: Min assist;From bed;With upper extremity assist Stand to Sit: 4: Min assist;To bed;With upper extremity assist Details for Transfer Assistance: min verbal cues to get feet fully on floor before standing and for hand placement.    Exercise    Balance Balance Balance Assessed: Yes Dynamic Standing Balance Dynamic Standing - Level of Assistance: 4: Min assist  End of Session OT - End of Session Equipment Utilized During Treatment: Gait belt Activity Tolerance: Patient tolerated treatment well Patient left: in bed;with call bell/phone within reach;with family/visitor present;Other (comment) (bed alarm not working. nsg aware. family reports will stay)  GO     Lennox Laity [phone] 04/24/2012, 11:10 AM

## 2012-04-24 NOTE — Progress Notes (Addendum)
[facility] PROGRESS NOTE  Erik [name]:[id number] DOB: [birth year] DOA: 04/22/2012 PCP: Rene Paci, MD  Assessment/Plan: 1-SOB (shortness of breath): Resolved now. No source of infection identified. Chest x-ray has demonstrated no acute infiltrates. Patient is afebrile and at this point his increase work of breathing prior to admission was most likely physiologically response to his acute metabolic acidosis.   2-metabolic acidosis: Most likely secondary to acute renal failure in the settings of pre-renal azotemia due to dehydration (poor PO intake) and continue use of diuretics/ACE inhibitors. Will continue holding diuretics and ACE inhibitors. Patient drinking better; will change IVf to [address]; Will follow BMET in am, to follow electrolytes and kidney function. Bicarb is 22 today  3-HYPERTENSION: Stable. Will continue the use of amlodipine and hydralazine.   4-Acute kidney injury: Secondary to decreased by mouth intake and continue use of diuretics/ACE inhibitors. Will continue holding nephrotoxic agents; No hydronephrosis and urine culture unrevealing of infection. Cr 1.37 today and improving.  5-Chronic diastolic heart failure: Stable and per physical exam patient is euvolemic. Will continue holding Lasix at this moment. Continue daily weight, heart healthy diet, close followup of his intake and output. CE'z negative X3.  6-COPD: No wheezing. Currently stable. Will use when necessary Combivent for shortness of breath/wheezing if he develops any.   7-Hyperkalemia: Do to acute renal failure. Back to WNL. Will d/c telemetry and check BMET in am  8-Dementia: Continue supportive care. Continue donepezil.   9-BPH (benign prostatic hyperplasia): Continue Flomax.  10-HLD: continue statins.  11-GERD: continue PPI.  12-Deconditioning: will arrange HHPT/HHOT as per recommendations by OT/PT. Also discussed with family members about 24/7 assistance and care needs.  Code Status:  Full Family Communication: Family members at bedside and updated (Son and Daughter) Disposition Plan: Per patient he will like to go back home; will ask Pt/OT to evaluate and help with discharge disposition based on evaluation results.   Brief narrative: 71 year old male with a past medical history significant for hypertension, COPD, hyperlipidemia, dementia; history of a stroke in the past with residual deficit on his left upper and lower extremities; came into the hospital secondary to increased shortness of breath and weakness. Patient was recently evaluated by cardiology with a negative Myoview 1 week prior to admission (Dr. Morene Antu cardiology).  Consultants:  None  Procedures:  Renal US (Pending)  Antibiotics:  none  HPI/Subjective: No overnight complaints; no CP or SOB; able to speak in full sentences. Afebrile  Objective: Filed Vitals:   04/24/12 0526 04/24/12 1004 04/24/12 1020 04/24/12 1040  BP: 133/64 135/68    Pulse: 60  67   Temp: 97.6 F (36.4 C)     TempSrc: Oral     Resp: 18     Height:      Weight: 71.2 kg (156 lb 15.5 oz)     SpO2: 93%  95% 96%    Intake/Output Summary (Last 24 hours) at 04/24/12 1104 Last data filed at 04/24/12 0855  Gross per 24 hour  Intake    240 ml  Output   1350 ml  Net  -1110 ml    Exam:   General: NAD; Afebrile.  Cardiovascular: S1 and S2; no rubs or gallops  Respiratory: No wheezing and no rales  Abdomen: soft; NT/ND; +BS  Neurology: AAOX2; CN intact and no new focal deficit appreciated. LUE and LLE with 3-4/5 MS (chronic, residual from CVA)  Extremities: no cyanosis or clubbing; trace edema.  Data Reviewed: Basic Metabolic Panel:  Lab 04/24/12 8119  04/23/12 0205 04/22/12 1925 04/22/12 1255 04/22/12 1147  NA 137 135 133* -- 135  K 4.8 5.5* 5.3* >7.5* 7.2*  CL 105 107 103 -- 110  CO2 23 19 18* -- 16*  GLUCOSE 96 93 147* -- 98  BUN 22 20 24* -- 28*  CREATININE 1.37* 1.38* 1.56* -- 1.57*  CALCIUM 8.8  8.2* 8.8 -- 9.3  MG -- -- 1.7 -- --  PHOS -- -- 4.5 -- --   Liver Function Tests:  Lab 04/22/12 1925  AST 28  ALT 33  ALKPHOS 56  BILITOT 0.1*  PROT 6.0  ALBUMIN 3.3*   CBC:  Lab 04/24/12 0425 04/23/12 0205 04/22/12 1147  WBC 7.4 6.1 6.5  NEUTROABS -- -- --  HGB 11.6* 10.7* 13.2  HCT 35.7* 31.7* 40.1  MCV 92.2 90.6 90.7  PLT 176 159 201   Cardiac Enzymes:  Lab 04/23/12 0959 04/23/12 0205 04/22/12 1925 04/22/12 1147  CKTOTAL 52 39 32 --  CKMB 3.5 3.2 3.4 --  CKMBINDEX -- -- -- --  TROPONINI <0.30 <0.30 <0.30 <0.30   BNP (last 3 results)  Basename 04/22/12 1147  PROBNP 463.3*     Recent Results (from the past 240 hour(s))  MRSA PCR SCREENING     Status: Normal   Collection Time   04/22/12  3:43 PM      Component Value Range Status Comment   MRSA by PCR NEGATIVE  NEGATIVE Final   URINE CULTURE     Status: Normal   Collection Time   04/22/12  6:04 PM      Component Value Range Status Comment   Specimen Description URINE, RANDOM   Final    Special Requests none Normal   Final    Culture  Setup Time 04/22/2012 23:23   Final    Colony Count 60,000 COLONIES/ML   Final    Culture     Final    Value: Multiple bacterial morphotypes present, none predominant. Suggest appropriate recollection if clinically indicated.   Report Status 04/24/2012 FINAL   Final      Studies: Dg Chest 2 View  04/22/2012  *RADIOLOGY REPORT*  Clinical Data: Shortness of breath with exertion, history of lung cancer status post right upper lobectomy  CHEST - 2 VIEW  Comparison: Most recent prior chest x-ray 12/10/2011, most recent prior chest CT 10/23/2009  Findings: Surgical changes of prior median sternotomy and multivessel CABG including LIMA bypass.  Surgical clips are also noted in the right hilum consistent with prior right upper lobectomy.  There is hyperexpansion of the right lower, and middle lobes with chronic blunting of the right costophrenic angle.  The lungs are negative for edema,  focal airspace consolidation, pleural effusion, or pneumothorax.  Cardiac and mediastinal contours are within normal limits. Surgical clips in the right upper quadrant suggest prior cholecystectomy.  Remote healed fracture of the right seventh rib, unchanged.  No acute osseous findings.  IMPRESSION: 1.  No acute cardiopulmonary disease. 2.  Surgical changes consistent with prior right upper lobectomy, median sternotomy and multivessel CABG.  Original Report Authenticated By: Vilma Prader    Scheduled Meds:    . amLODipine  10 mg Oral Daily  . aspirin  325 mg Oral Daily  . divalproex  250 mg Oral TID  . donepezil  5 mg Oral Daily  . enoxaparin (LOVENOX) injection  40 mg Subcutaneous Q24H  . hydrALAZINE  50 mg Oral QID  . mirtazapine  15 mg Oral QHS  . pantoprazole  40 mg Oral Q1200  . PARoxetine  20 mg Oral Daily  . simvastatin  5 mg Oral q1800  . Tamsulosin HCl  0.4 mg Oral Daily   Continuous Infusions:    . sodium chloride 75 mL/hr at 04/23/12 1838     Time spent: > 30 minutes    Lucan Riner  [facility] Pager [phone]. If 8PM-8AM, please contact night-coverage at [email or web address], password [facility] 04/24/2012, 11:04 AM  LOS: 2 days

## 2012-04-24 NOTE — Evaluation (Addendum)
Physical Therapy Evaluation Patient Details Name: Erik [name] MRN: [id number] DOB: 03-Jul-1941 Today's Date: 04/24/2012 Time: 9629-5284 PT Time Calculation (min): 31 min  PT Assessment / Plan / Recommendation Clinical Impression  Pt presents with SOB with history of COPD, HTN, CVA and dementia.  Tolerated ambulation in hallway well with SPC at min assist level.  Noted some intermittent LOB/instability with ambulation, esp when pt talking with people in hallway.  Pts O2 sats remained at 94% during ambulation on RA.  Pt will benefit from skilled PT in acute venue to address strength, mobility and balance deficits.  PT recommends HHPT with 24/7 close supervision due to cognitive deficits and high risk of falls.      PT Assessment  Patient needs continued PT services    Follow Up Recommendations  Home health PT    Barriers to Discharge None      Equipment Recommendations  None recommended by PT    Recommendations for Other Services     Frequency Min 3X/week    Precautions / Restrictions Precautions Precautions: Fall Required Braces or Orthoses: Other Brace/Splint Other Brace/Splint: L AFO, has worn since stroke Restrictions Weight Bearing Restrictions: No   Pertinent Vitals/Pain No pain      Mobility  Bed Mobility Bed Mobility: Supine to Sit Supine to Sit: 4: Min guard Details for Bed Mobility Assistance: Min guard to steady trunk once in sitting.  Cues for safety and technique.  Transfers Transfers: Sit to Stand;Stand to Sit Sit to Stand: 4: Min assist;With upper extremity assist;From bed;From toilet Stand to Sit: 4: Min assist;With upper extremity assist;To bed;To toilet Details for Transfer Assistance: Performed transfers x 2 in order to use restroom.  Cues for hand placement and safety when sitting/standing.  Ambulation/Gait Ambulation/Gait Assistance: 4: Min assist Ambulation Distance (Feet): 200 Feet Assistive device: Straight cane Ambulation/Gait Assistance  Details: Pt ambulates with own SPC at min assist level for steadying due to noted imbalance (from previous stroke).  Cues for safety and to attend to task as pt is easily distracted by wanting to talk to others.  Gait Pattern: Step-through pattern;Decreased stride length;Left steppage;Ataxic;Trendelenburg Gait velocity: decreased Stairs: No Wheelchair Mobility Wheelchair Mobility: No    Exercises     PT Diagnosis: Abnormality of gait;Generalized weakness  PT Problem List: Decreased strength;Decreased activity tolerance;Decreased balance;Decreased mobility;Decreased coordination;Decreased safety awareness;Decreased knowledge of use of DME;Decreased cognition PT Treatment Interventions: DME instruction;Gait training;Stair training;Functional mobility training;Therapeutic activities;Therapeutic exercise;Balance training;Patient/family education   PT Goals Acute Rehab PT Goals PT Goal Formulation: With patient Time For Goal Achievement: 05/01/12 Potential to Achieve Goals: Good Pt will go Sit to Stand: with supervision PT Goal: Sit to Stand - Progress: Goal set today Pt will go Stand to Sit: with supervision PT Goal: Stand to Sit - Progress: Goal set today Pt will Ambulate: >150 feet;with supervision;with least restrictive assistive device PT Goal: Ambulate - Progress: Goal set today Pt will Go Up / Down Stairs: 1-2 stairs;with supervision;with least restrictive assistive device PT Goal: Up/Down Stairs - Progress: Goal set today Pt will Perform Home Exercise Program: with supervision, verbal cues required/provided PT Goal: Perform Home Exercise Program - Progress: Goal set today  Visit Information  Last PT Received On: 04/24/12 Assistance Needed: +1    Subjective Data  Subjective: I want to walk again Patient Stated Goal: to go home.    Prior Functioning  Home Living Lives With: Son (daughter will also be helping) Available Help at Discharge: Family Type of Home: House Home  Access: Stairs to enter [employer] of Steps: 2 Entrance Stairs-Rails: Left;Right;Can reach both Home Layout: One level Bathroom Shower/Tub: Tub/shower unit;Other (comment) (has grab bars) Home Adaptive Equipment: Shower chair with back;Walker - rolling;Straight cane;Wheelchair - manual Prior Function Level of Independence: Independent with assistive device(s) Able to Take Stairs?: Yes Driving: No Vocation: Retired Comments: Pt has had a few falls per daughter, with some recently Communication Communication: No difficulties Dominant Hand: Right    Cognition  Overall Cognitive Status: Appears within functional limits for tasks assessed/performed Arousal/Alertness: Awake/alert Orientation Level: Oriented X4 / Intact Behavior During Session: [facility] for tasks performed Cognition - Other Comments: pt also impulsive at times.     Extremity/Trunk Assessment Right Upper Extremity Assessment RUE ROM/Strength/Tone: [facility] for tasks assessed Left Upper Extremity Assessment LUE ROM/Strength/Tone: Deficits LUE ROM/Strength/Tone Deficits: approximately 140 degrees should flexion actively; PROM WFL. Elbow strength 4-/5. Grip fair +. Decreased motor control and note spontaneous movements.  Right Lower Extremity Assessment RLE ROM/Strength/Tone: WFL for tasks assessed RLE Coordination: WFL - gross motor Left Lower Extremity Assessment LLE ROM/Strength/Tone: Deficits LLE ROM/Strength/Tone Deficits: At least 3/5 per functional assessment, however noted slight steppage gait pattern even with AFO to control foot drop.  LLE Coordination: WFL - gross motor Trunk Assessment Trunk Assessment: Kyphotic   Balance Balance Balance Assessed: Yes Dynamic Standing Balance Dynamic Standing - Level of Assistance: 4: Min assist  End of Session PT - End of Session Equipment Utilized During Treatment: Gait belt Activity Tolerance: Patient tolerated treatment well Patient left: in bed;with call bell/phone  within reach;with bed alarm set;with family/visitor present Nurse Communication: Mobility status;Other (comment) (ok to amb pt in hall with gait belt. )  GP     Page, Meribeth Mattes 04/24/2012, 2:08 PM

## 2012-04-25 LAB — BASIC METABOLIC PANEL
BUN: 22 mg/dL (ref 6–23)
Calcium: 8.9 mg/dL (ref 8.4–10.5)
Creatinine, Ser: 1.41 mg/dL — ABNORMAL HIGH (ref 0.50–1.35)
GFR calc Af Amer: 56 mL/min — ABNORMAL LOW (ref >90)
GFR calc non Af Amer: 48 mL/min — ABNORMAL LOW (ref >90)
Potassium: 4.8 mEq/L (ref 3.5–5.1)

## 2012-04-25 MED ORDER — IPRATROPIUM-ALBUTEROL 20-100 MCG/ACT IN AERS: 1.0000 | INHALATION_SPRAY | Freq: Four times a day (QID) | RESPIRATORY_TRACT | Status: DC | PRN

## 2012-04-25 NOTE — Plan of Care (Signed)
Problem: Phase III Progression Outcomes Goal: Activity at appropriate level-compared to baseline (UP IN CHAIR FOR HEMODIALYSIS)  Outcome: Not Applicable Date Met:  04/25/12 Not a dialysis patient.

## 2012-04-25 NOTE — Progress Notes (Signed)
Patient discharged home with daughter, discharge instructions given and explained to patient/daughter and they verbalized understanding, patient denies any pain or distress; skin intact no wound but bruises on BUE. Transported home by daughter. F/U outpatient with PCP.

## 2012-04-25 NOTE — Discharge Summary (Signed)
Physician Discharge Summary  CRANDALL HARVEL NWG:[id number] DOB: 02-08-1941 DOA: 04/22/2012  PCP: Rene Paci, MD  Admit date: 04/22/2012 Discharge date: 04/25/2012  Recommendations for Outpatient Follow-up:  1. PCP follow up in 7-10 days (Check BEMT to follow renal function; also reevaluate BP and needs for ACE inhibitors given hyperkalemia and renal failure)  Discharge Diagnoses:  Principal Problem:  *SOB (shortness of breath) Active Problems:  HYPERTENSION  Chronic diastolic heart failure  COPD  Hyperkalemia  Acute kidney injury  Dementia  BPH (benign prostatic hyperplasia)   Discharge Condition: stable and in improved condition. Will arranged HHPT/HHOT and HH aide to help providing better care.  Diet recommendation: heart healthy  Wt Readings from Last 3 Encounters:  04/25/12 68.1 kg (150 lb 2.1 oz)  04/16/12 71.668 kg (158 lb)  04/02/12 72.576 kg (160 lb)    History of present illness:  71 year old male with a past medical history significant for hypertension, COPD, hyperlipidemia, dementia; history of a stroke in the past with residual deficit on his left upper and lower extremities; came into the hospital secondary to increased shortness of breath and weakness. Patient was recently evaluated by cardiology with a negative Myoview 1 week prior to admission (Dr. Morene Antu cardiology). He reports that his shortness of breath has been gradually worsening over the past abnormal by even worse over the last 2 days prior to admission; family member as reports some cough and some difficulty with his breathing especially when he is lying down flat.   Hospital Course:  1-SOB (shortness of breath): Resolved now. No source of infection identified. Chest x-ray has demonstrated no acute infiltrates. Patient afebrile and at this point his increase work of breathing prior to admission was most likely physiologically response to his acute metabolic acidosis (due to dehydration and  ARF).   2-metabolic acidosis: Most likely secondary to acute renal failure in the settings of pre-renal azotemia due to dehydration (poor PO intake) and continue use of diuretics/ACE inhibitors. Will hold ACE inhibitors until follow up with PCP. Patient and family advise to maintain good hydration. Low dose lasix restarted. Bicarb 23 at discharge.   3-HYPERTENSION: Stable. Will restart low dose lasix and continue the use of amlodipine and hydralazine.   4-Acute kidney injury: Secondary to decreased by mouth intake and continue use of diuretics/ACE inhibitors. Will continue holding ACE inhibitors until he follow with PCP; No hydronephrosis in renal US and urine culture unrevealing infection. Cr 1.37 at discharge. Will restart low dose lasix and patient has been advised to keep himself hydrated.   5-Chronic diastolic heart failure: Stable and per physical exam patient is euvolemic. Will resume low dose Lasix at this moment. Continue heart healthy diet, close followup with PCP. CE'z negative X3.   6-COPD: No wheezing. Currently stable. Will continue PRN combivent.   7-Hyperkalemia: Do to acute renal failure. Back to WNL.    8-Dementia: Continue supportive care. Continue donepezil.   9-BPH (benign prostatic hyperplasia): Continue Flomax.   10-HLD: continue statins.   11-GERD: continue PPI.   12-Deconditioning: will arrange HHPT/HHOT as per recommendations by OT/PT. Also discussed with family members about 24/7 assistance and care needs.   Consultations:  None  Discharge Exam: Filed Vitals:   04/25/12 1003  BP: 128/58  Pulse:   Temp:   Resp:    Filed Vitals:   04/24/12 1754 04/24/12 2057 04/25/12 0530 04/25/12 1003  BP: 125/62 149/66 121/73 128/58  Pulse:  58 56   Temp:  98.2 F (36.8 C) 98.2  F (36.8 C)   TempSrc:  Oral Oral   Resp:  18 16   Height:      Weight:   68.1 kg (150 lb 2.1 oz)   SpO2:  95%      General: No acute distress; cooperative to examination; denies  shortness of breath. Able to speak in full sentences Cardiovascular: S1 and S2 appreciated; no rubs or gallops Respiratory: Clear to Auscultation Abdomen: Soft, nontender/nondistended; positive bowel sounds; no guarding. Neurologic: Cranial nerve intact; left upper and left lower extremities with baseline deficit from previous stroke; no new focal motor or sensory deficit appreciated.  Discharge Instructions  Discharge Orders    Future Appointments: Provider: Department: Dept Phone: Center:   06/29/2012 9:00 AM Newt Lukes, MD [facility] [phone] [facility]     Future Orders Please Complete By Expires   Diet - low sodium heart healthy      Increase activity slowly      Discharge instructions      Comments:   -Keep yourself well hydrated -Take medications as prescribed -Hold lisinopril until follow up with PCP     Medication List  As of 04/25/2012  1:29 PM   STOP taking these medications         lisinopril 40 MG tablet         TAKE these medications         amLODipine 10 MG tablet   Commonly known as: NORVASC   Take 1 tablet (10 mg total) by mouth daily.      aspirin 325 MG EC tablet   Take 325 mg by mouth daily.      divalproex 250 MG DR tablet   Commonly known as: DEPAKOTE   Take 1 tablet (250 mg total) by mouth 3 (three) times daily.      donepezil 5 MG tablet   Commonly known as: ARICEPT   Take 1 tablet (5 mg total) by mouth daily.      esomeprazole 40 MG capsule   Commonly known as: NEXIUM   Take 1 capsule (40 mg total) by mouth daily before breakfast.      furosemide 20 MG tablet   Commonly known as: LASIX   Take 1 tablet (20 mg total) by mouth daily.      hydrALAZINE 50 MG tablet   Commonly known as: APRESOLINE   Take 1 tablet (50 mg total) by mouth 4 (four) times daily.      Ipratropium-Albuterol 20-100 MCG/ACT Aers respimat   Commonly known as: COMBIVENT   Inhale 1 puff into the lungs every 6 (six) hours as needed for wheezing.      lovastatin 40  MG tablet   Commonly known as: MEVACOR   Take 1 tablet (40 mg total) by mouth at bedtime.      methocarbamol 500 MG tablet   Commonly known as: ROBAXIN   Take 1 tablet (500 mg total) by mouth 3 (three) times daily as needed (muscle spasm).      mirtazapine 15 MG tablet   Commonly known as: REMERON   Take 1 tablet (15 mg total) by mouth at bedtime.      nitroGLYCERIN 0.4 MG/SPRAY spray   Commonly known as: NITROLINGUAL   Place 1 spray under the tongue every 5 (five) minutes as needed. For chest pain.      PARoxetine 20 MG tablet   Commonly known as: PAXIL   Take 1 tablet (20 mg total) by mouth daily.  Tamsulosin HCl 0.4 MG Caps   Commonly known as: FLOMAX   Take 1 capsule (0.4 mg total) by mouth daily.      traMADol-acetaminophen 37.5-325 MG per tablet   Commonly known as: ULTRACET   Take 1 tablet by mouth every 6 (six) hours as needed for pain.           Follow-up Information    Follow up with Rene Paci, MD. Schedule an appointment as soon as possible for a visit in 10 days.   Contact information:   [address] [facility] [address] [place] [address] [phone]           The results of significant diagnostics from this hospitalization (including imaging, microbiology, ancillary and laboratory) are listed below for reference.    Significant Diagnostic Studies: Dg Chest 2 View  04/22/2012  *RADIOLOGY REPORT*  Clinical Data: Shortness of breath with exertion, history of lung cancer status post right upper lobectomy  CHEST - 2 VIEW  Comparison: Most recent prior chest x-ray 12/10/2011, most recent prior chest CT 10/23/2009  Findings: Surgical changes of prior median sternotomy and multivessel CABG including LIMA bypass.  Surgical clips are also noted in the right hilum consistent with prior right upper lobectomy.  There is hyperexpansion of the right lower, and middle lobes with chronic blunting of the right costophrenic angle.  The  lungs are negative for edema, focal airspace consolidation, pleural effusion, or pneumothorax.  Cardiac and mediastinal contours are within normal limits. Surgical clips in the right upper quadrant suggest prior cholecystectomy.  Remote healed fracture of the right seventh rib, unchanged.  No acute osseous findings.  IMPRESSION: 1.  No acute cardiopulmonary disease. 2.  Surgical changes consistent with prior right upper lobectomy, median sternotomy and multivessel CABG.  Original Report Authenticated By: Vilma Prader   US Abdomen Complete  04/23/2012  *RADIOLOGY REPORT*  Clinical Data:  Acute renal failure.  Shortness of breath.  Prior cholecystectomy.  History of lung cancer.  COMPLETE ABDOMINAL ULTRASOUND  Comparison:  PET of 06/28/2005  Findings:  Gallbladder:  Surgically absent  Common bile duct: Normal, 4 mm.  Liver: Normal in echogenicity, without focal lesion.  IVC: Negative  Pancreas:  Obscured by overlying bowel gas.  Spleen:  Normal in size and echogenicity.  Right Kidney:  9.7 cm.  Mild to moderate cortical thinning with mildly increased echogenicity.  2 renal cysts or minimally complex cysts.  These measure 1.3 and 1.4 cm. No hydronephrosis.  Left Kidney:  10.5 cm.  Mild to moderate renal cortical thinning with mildly increased echogenicity. No hydronephrosis.  Abdominal aorta:  Partially obscured by bowel gas.  No aneurysm or ascites identified.  IMPRESSION:  1. No hydronephrosis.  Renal cortical thinning and increased echogenicity, suggesting medical renal disease. 2. Portions of anatomy obscured by overlying bowel gas.  Original Report Authenticated By: Consuello Bossier, M.D.    Microbiology: Recent Results (from the past 240 hour(s))  MRSA PCR SCREENING     Status: Normal   Collection Time   04/22/12  3:43 PM      Component Value Range Status Comment   MRSA by PCR NEGATIVE  NEGATIVE Final   URINE CULTURE     Status: Normal   Collection Time   04/22/12  6:04 PM      Component Value Range Status  Comment   Specimen Description URINE, RANDOM   Final    Special Requests none Normal   Final  Culture  Setup Time 04/22/2012 23:23   Final    Colony Count 60,000 COLONIES/ML   Final    Culture     Final    Value: Multiple bacterial morphotypes present, none predominant. Suggest appropriate recollection if clinically indicated.   Report Status 04/24/2012 FINAL   Final      Labs: Basic Metabolic Panel:  Lab 04/25/12 1610 04/24/12 0425 04/23/12 0205 04/22/12 1925 04/22/12 1255 04/22/12 1147  NA 139 137 135 133* -- 135  K 4.8 4.8 5.5* 5.3* >7.5* --  CL 108 105 107 103 -- 110  CO2 23 23 19  18* -- 16*  GLUCOSE 85 96 93 147* -- 98  BUN 22 22 20  24* -- 28*  CREATININE 1.41* 1.37* 1.38* 1.56* -- 1.57*  CALCIUM 8.9 8.8 8.2* 8.8 -- 9.3  MG -- -- -- 1.7 -- --  PHOS -- -- -- 4.5 -- --   Liver Function Tests:  Lab 04/22/12 1925  AST 28  ALT 33  ALKPHOS 56  BILITOT 0.1*  PROT 6.0  ALBUMIN 3.3*   CBC:  Lab 04/24/12 0425 04/23/12 0205 04/22/12 1147  WBC 7.4 6.1 6.5  NEUTROABS -- -- --  HGB 11.6* 10.7* 13.2  HCT 35.7* 31.7* 40.1  MCV 92.2 90.6 90.7  PLT 176 159 201   Cardiac Enzymes:  Lab 04/23/12 0959 04/23/12 0205 04/22/12 1925 04/22/12 1147  CKTOTAL 52 39 32 --  CKMB 3.5 3.2 3.4 --  CKMBINDEX -- -- -- --  TROPONINI <0.30 <0.30 <0.30 <0.30   BNP: BNP (last 3 results)  Basename 04/22/12 1147  PROBNP 463.3*    Time coordinating discharge: >30 minutes  Signed:  Kordel Leavy [phone]  [facility] 04/25/2012, 1:29 PM

## 2012-04-25 NOTE — Progress Notes (Signed)
Cm spoke with patient with daughter present at bedside concerning dc planning. Per pt choice [facility] to provide [facility] services upon discharge. Patient states having cane to assist with mobility. Pt's adult daughter & son to assist in home care. Adult son lives with patient. [facility] notified of referral. Demographics, H/P, Md orders faxed to [facility] at [phone]. No other needs specified at this time.   Leonie Green [phone]

## 2012-04-30 ENCOUNTER — Other Ambulatory Visit (INDEPENDENT_AMBULATORY_CARE_PROVIDER_SITE_OTHER): Payer: Medicare Other

## 2012-04-30 ENCOUNTER — Ambulatory Visit (INDEPENDENT_AMBULATORY_CARE_PROVIDER_SITE_OTHER): Payer: Medicare Other | Admitting: Internal Medicine

## 2012-04-30 ENCOUNTER — Encounter: Payer: Self-pay | Admitting: Internal Medicine

## 2012-04-30 VITALS — BP 140/68 | HR 70 | Temp 97.7°F | Ht 67.0 in | Wt 162.6 lb

## 2012-04-30 DIAGNOSIS — R0602 Shortness of breath: Secondary | ICD-10-CM

## 2012-04-30 DIAGNOSIS — F0151 Vascular dementia with behavioral disturbance: Secondary | ICD-10-CM

## 2012-04-30 DIAGNOSIS — I5032 Chronic diastolic (congestive) heart failure: Secondary | ICD-10-CM

## 2012-04-30 DIAGNOSIS — E875 Hyperkalemia: Secondary | ICD-10-CM

## 2012-04-30 LAB — BASIC METABOLIC PANEL
BUN: 20 mg/dL (ref 6–23)
Calcium: 8.8 mg/dL (ref 8.4–10.5)
Creatinine, Ser: 1.5 mg/dL (ref 0.4–1.5)
GFR: 49.86 mL/min — ABNORMAL LOW (ref >60.00)

## 2012-04-30 NOTE — Patient Instructions (Signed)
It was good to see you today. We have reviewed your prior records including labs and tests today Test(s) ordered today. Your results will be called to you after review (48-72hours after test completion). If any changes need to be made, you will be notified at that time. Monitor your weight at home and call if >2 pound change over 72h period we'll make referral to home health for nursing, therapy and aide assistance. Our office will contact you regarding appointment(s) once made. Medications reviewed, no changes at this time. Please keep schedule followup in October, call sooner if problems.

## 2012-04-30 NOTE — Progress Notes (Signed)
Subjective:    Patient ID: Erik [name], male    DOB: Feb 02, 1941, 71 y.o.   MRN: [id number]  HPI here for hospital follow up -  Admit date: 04/22/2012 Discharge date: 04/25/2012  Recommendations for Outpatient Follow-up:   1. PCP follow up in 7-10 days (Check BEMT to follow renal function; also reevaluate BP and needs for ACE inhibitors given hyperkalemia and renal failure)  Discharge Diagnoses:   Principal Problem:  *SOB (shortness of breath) Active Problems:  HYPERTENSION  Chronic diastolic heart failure  COPD  Hyperkalemia  Acute kidney injury  Dementia  BPH (benign prostatic hyperplasia)  Recurrent episodes of severe shortness of breath and chest pain - Negative myoview 03/2012  also reviewed chronic medical issues::  Dementia -on remeron and depakote - continued fluctations in eratic behavior - dtr's letter stating concerns was scanned into EMR 10/2010 OV unable to complete cog eval 07/12/10 with [facility] due to lack of participation with testing 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 denies adverse side effects related to current therapy.  no chest pain or dizziness  mood do -reports compliance with ongoing medical treatment and no changes in medication dose or frequency. denies adverse side effects related to current therapy. less irritable per family, no loss of appetite and less onging weight loss - "i'm ready for the lord to take me" but denies sadness.  dyslipidemia - reports compliance with ongoing medical treatment and no changes in medication dose or frequency. denies adverse side effects related to current therapy. no GI or muscle problems  hx lung ca surg - saw hendrickson again early 2011 for same because of inc pain at surgical hernia (no nausea, vomiting or inablilty to reduce) - no problems indetified on exam or CT    Past Medical History  Diagnosis  Date  . Hypertension   . COPD (chronic obstructive pulmonary disease)   . BPH (benign prostatic hyperplasia)   . Hyperlipidemia   . Dementia, vascular, with delusions   . Mood disorder   . 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 CAD (coronary artery disease)     CABG  . AAA (abdominal aortic aneurysm)   . GERD (gastroesophageal reflux disease)   . Lung cancer 2008    R lung resection  . Alzheimer disease      Review of Systems  Constitutional: Positive for fatigue. Negative for fever and unexpected weight change.  Respiratory: Positive for shortness of breath. Negative for cough.   Cardiovascular: Negative for chest pain, palpitations and leg swelling.  Musculoskeletal: Negative for back pain and joint swelling.  Neurological: Negative for seizures, syncope and headaches.       Objective:   Physical Exam  BP 140/68  Pulse 70  Temp 97.7 F (36.5 C) (Oral)  Ht 5\' 7"  (1.702 m)  Wt 162 lb 9.6 oz (73.755 kg)  BMI 25.47 kg/m2  SpO2 96% Wt Readings from Last 3 Encounters:  04/30/12 162 lb 9.6 oz (73.755 kg)  04/25/12 150 lb 2.1 oz (68.1 kg)  04/16/12 158 lb (71.668 kg)   Constitutional: appears well-developed and well-nourished. No distress.  Dtr at side Neck: Normal range of motion. Neck supple. No JVD present. No thyromegaly present.  Cardiovascular: Normal rate, regular rhythm and normal heart sounds.  No murmur heard. no BLE edema Pulmonary/Chest: Effort normal and breath sounds normal. No respiratory distress. no wheezes.  Neurological:  he is alert and oriented to person, place, and time. No cranial nerve deficit. Coordination normal. chronic LHP L>UE, walks with cane asst Psychiatric: he has a eccentric expansive mood and affect. Tangential though - hyperactive behavior. Judgement impaired.  Lab Results  Component Value Date   WBC 7.4 04/24/2012   HGB 11.6* 04/24/2012   HCT 35.7* 04/24/2012   PLT 176 04/24/2012   GLUCOSE 85 04/25/2012   CHOL 132 12/06/2011   TRIG 78.0  12/06/2011   HDL 41.40 12/06/2011   LDLCALC 75 12/06/2011   ALT 33 04/22/2012   AST 28 04/22/2012   NA 139 04/25/2012   K 4.8 04/25/2012   CL 108 04/25/2012   CREATININE 1.41* 04/25/2012   BUN 22 04/25/2012   CO2 23 04/25/2012   TSH 2.082 04/22/2012   HGBA1C  Value: 6.0 (NOTE)   The ADA recommends the following therapeutic goal for glycemic   control related to Hgb A1C measurement:   Goal of Therapy:   < 7.0% Hgb A1C   Reference: American Diabetes Association: Clinical Practice   Recommendations 2008, Diabetes Care,  2008, 31:(Suppl 1). 10/05/2008    MRI brain 10/06/2008 reviewed - no acute, no mass - chronic vasc changes  2D echo 08/13/11: Study Conclusions  - Left ventricle: The cavity size was normal. Wall thickness   was increased in a pattern of mild LVH. Systolic function   was normal. The estimated ejection fraction was in the   range of 55% to 60%. Wall motion was normal; there were no   regional wall motion abnormalities. Doppler parameters are   consistent with abnormal left ventricular relaxation   (grade 1 diastolic dysfunction). - Pulmonary arteries: Systolic pressure was mildly   increased.  Myoview 03/2012 neg for ischemia  Assessment & Plan:  Episodic shortness of breath - etiology unclear Dtr also concerned with progressive fatigue and dyspnea on exertion -  Negative myoview stress test 03/2012 no anginal equivalent despite CAD/CABG hx  Also see problem list. Medications and labs reviewed today.

## 2012-04-30 NOTE — Assessment & Plan Note (Signed)
Doing reasonably well on remeron and depakote - cont same Prior MRI brain 09/2008 and labs reviewed - no neuro or psyc changes DMV revoke drivers lic 04/26/12 HHPT/OT and aide to assist in care to avoid placement as long as possible

## 2012-04-30 NOTE — Assessment & Plan Note (Signed)
Suspect multifactorial - mild diast dysfx on echo 11/12, normal LVEF Also hx COPD, s/p partial lung resection from prior lung ca Continue Combivent prn, cardiac meds - dtr to monitor weights

## 2012-04-30 NOTE — Assessment & Plan Note (Signed)
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

## 2012-05-18 DIAGNOSIS — I5032 Chronic diastolic (congestive) heart failure: Secondary | ICD-10-CM

## 2012-05-18 DIAGNOSIS — J441 Chronic obstructive pulmonary disease with (acute) exacerbation: Secondary | ICD-10-CM

## 2012-05-18 DIAGNOSIS — I509 Heart failure, unspecified: Secondary | ICD-10-CM

## 2012-05-18 DIAGNOSIS — I69998 Other sequelae following unspecified cerebrovascular disease: Secondary | ICD-10-CM

## 2012-06-29 ENCOUNTER — Ambulatory Visit (INDEPENDENT_AMBULATORY_CARE_PROVIDER_SITE_OTHER): Payer: Medicare Other | Admitting: Internal Medicine

## 2012-06-29 ENCOUNTER — Encounter: Payer: Self-pay | Admitting: Internal Medicine

## 2012-06-29 VITALS — BP 124/62 | HR 68 | Temp 98.1°F | Ht 67.0 in | Wt 163.0 lb

## 2012-06-29 DIAGNOSIS — Z23 Encounter for immunization: Secondary | ICD-10-CM

## 2012-06-29 DIAGNOSIS — F0151 Vascular dementia with behavioral disturbance: Secondary | ICD-10-CM

## 2012-06-29 DIAGNOSIS — I10 Essential (primary) hypertension: Secondary | ICD-10-CM

## 2012-06-29 DIAGNOSIS — I5032 Chronic diastolic (congestive) heart failure: Secondary | ICD-10-CM

## 2012-06-29 DIAGNOSIS — Z0000 Encounter for general adult medical examination without abnormal findings: Secondary | ICD-10-CM

## 2012-06-29 DIAGNOSIS — Z1211 Encounter for screening for malignant neoplasm of colon: Secondary | ICD-10-CM

## 2012-06-29 MED ORDER — METHOCARBAMOL 500 MG PO TABS: 500.0000 mg | ORAL_TABLET | Freq: Three times a day (TID) | ORAL | Status: DC | PRN

## 2012-06-29 NOTE — Progress Notes (Signed)
Subjective:    Patient ID: Erik [name], male    DOB: [birth year], 71 y.o.   MRN: [id number]  HPI  Here for medicare wellness  Diet: heart healthy Physical activity: sedentary Depression/mood screen: negative Hearing: intact to whispered voice Visual acuity: grossly normal, performs annual eye exam  ADLs: capable Fall risk: none Home safety: good Cognitive evaluation: distractible - not participative to naming, recall and repetition EOL planning: adv directives, full code  I have personally reviewed and have noted 1. The patient's medical and social history 2. Their use of alcohol, tobacco or illicit drugs 3. Their current medications and supplements 4. The patient's functional ability including ADL's, fall risks, home safety risks and hearing or visual impairment. 5. Diet and physical activities 6. Evidence for depression or mood disorders   Also reviewed chronic medical issues::  Dementia -on remeron and depakote - continued fluctations in eratic behavior - dtr's letter stating concerns scanned into EMR 10/2010 OV unable to complete cognitive eval 06/2010 with [facility] due to lack of participation with testing DMV revoked driving lic 4/0/98 Dtr requests asst to possible ALF or SNF placement (bro leaving pt unattended)  hypertension - reports compliance with ongoing medical treatment and no changes in medication dose or frequency. denies adverse side effects related to current therapy.  no chest pain or dizziness  Mood disorder, see dementia above -reports compliance with ongoing medical treatment and no changes in medication dose or frequency. denies adverse side effects related to current therapy. less irritable per family, no loss of appetite and less onging weight loss - denies sadness.  dyslipidemia - reports compliance with ongoing medical treatment and no changes in medication dose or frequency. denies adverse side effects related to current therapy. no GI or muscle  problems  hx lung ca surg - saw [facility]on again early 2011 for same because of pain at surgical hernia (no nausea, vomiting or inablilty to reduce) - no problems indetified on exam or CT   Past Medical History  Diagnosis Date  . Hypertension   . COPD (chronic obstructive pulmonary disease)   . BPH (benign prostatic hyperplasia)   . Hyperlipidemia   . Dementia, vascular, with delusions   . Mood disorder   . 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 CAD (coronary artery disease)     CABG  . AAA (abdominal aortic aneurysm)   . GERD (gastroesophageal reflux disease)   . Lung cancer 2008    R lung resection  . Alzheimer disease    Family History  Problem Relation Age of Onset  . Arthritis Mother   . Arthritis Father   . Arthritis Maternal Grandmother   . Arthritis Maternal Grandfather   . Breast cancer Other   . Stroke Other     Parent not sure which 1   History  Substance Use Topics  . Smoking status: Former Smoker -- 0 years  . Smokeless tobacco: Never Used   Comment: supervised by dtr  . Alcohol Use: No     Review of Systems  Constitutional: Positive for activity change (fatigue with mild exertion, progressive x 22mo). Negative for fever and unexpected weight change.  Respiratory: Positive for shortness of breath. Negative for cough.   Cardiovascular: Positive for chest pain. Negative for palpitations and leg swelling.  Musculoskeletal: Negative for back pain.  Neurological: Negative for seizures, syncope and headaches.  No other specific complaints in a complete review of systems (except as listed in HPI above).  Objective:   Physical Exam  BP 124/62  Pulse 68  Temp 98.1 F (36.7 C) (Oral)  Ht 5\' 7"  (1.702 m)  Wt 163 lb (73.936 kg)  BMI 25.53 kg/m2  SpO2 97% Wt Readings from Last 3 Encounters:  06/29/12 163 lb (73.936 kg)  04/30/12 162 lb 9.6 oz (73.755 kg)  04/25/12 150 lb 2.1 oz (68.1 kg)   Constitutional: appears well-developed and well-nourished. No  distress.  Dtr at side Neck: Normal range of motion. Neck supple. No JVD present. No thyromegaly present.  Cardiovascular: Normal rate, regular rhythm and normal heart sounds.  No murmur heard. RLE 1+ edema compared to trace on LLE Pulmonary/Chest: Effort normal and breath sounds normal. No respiratory distress. no wheezes.  Abdomen: soft, small hernia over RUQ at side of prior thoracic surgical scar - nontender, +BS MSkel: R knee - boggy synovitis - tender to palpation over joint line; FROM and ligamentous function intact Neurological: he is alert and oriented to person, place, and time. No cranial nerve deficit. Coordination normal. chronic LHP L>UE, walks with cane asst Psychiatric: he has a eccentric expansive mood and affect. Tangential though - hyperactive behavior. Judgement impaired.  Lab Results  Component Value Date   WBC 7.4 04/24/2012   HGB 11.6* 04/24/2012   HCT 35.7* 04/24/2012   PLT 176 04/24/2012   GLUCOSE 93 04/30/2012   CHOL 132 12/06/2011   TRIG 78.0 12/06/2011   HDL 41.40 12/06/2011   LDLCALC 75 12/06/2011   ALT 33 04/22/2012   AST 28 04/22/2012   NA 139 04/30/2012   K 4.6 04/30/2012   CL 106 04/30/2012   CREATININE 1.5 04/30/2012   BUN 20 04/30/2012   CO2 27 04/30/2012   TSH 2.082 04/22/2012   HGBA1C  Value: 6.0 (NOTE)   The ADA recommends the following therapeutic goal for glycemic   control related to Hgb A1C measurement:   Goal of Therapy:   < 7.0% Hgb A1C   Reference: American Diabetes Association: Clinical Practice   Recommendations 2008, Diabetes Care,  2008, 31:(Suppl 1). 10/05/2008    MRI brain 10/06/2008 reviewed - no acute, no mass - chronic vasc changes  2D echo 08/13/11: Study Conclusions  - Left ventricle: The cavity size was normal. Wall thickness   was increased in a pattern of mild LVH. Systolic function   was normal. The estimated ejection fraction was in the   range of 55% to 60%. Wall motion was normal; there were no   regional wall motion abnormalities. Doppler  parameters are   consistent with abnormal left ventricular relaxation   (grade 1 diastolic dysfunction). - Pulmonary arteries: Systolic pressure was mildly   increased.   Myoview 03/2012 neg for ischemia   Assessment & Plan:  AWV/v70.0 - Today patient counseled on age appropriate routine health concerns for screening and prevention, each reviewed and up to date or declined. Immunizations reviewed and up to date or declined. Labs/ECG reviewed. Risk factors for depression reviewed and negative. Hearing function and visual acuity are intact. ADLs screened and addressed as needed. Functional ability and level of safety reviewed and appropriate. Education, counseling and referrals performed based on assessed risks today. Patient provided with a copy of personalized plan for preventive services.   Edema, RLE, mild - increase lasix to 20 bid x 5 d, then resume 20 qd -  consider steroid injection R knee OA flare if increase diuretics not improving RLE symptoms   see problem list. Medications and labs reviewed today.

## 2012-06-29 NOTE — Assessment & Plan Note (Signed)
The current medical regimen is effective;  continue present plan and medications.  BP Readings from Last 3 Encounters:  06/29/12 124/62  04/30/12 140/68  04/25/12 128/58

## 2012-06-29 NOTE — Assessment & Plan Note (Signed)
progressive DOE - multifactorial  Reviewed last echo 07/2011 and negative myoview 03/2012 Started low dose diuretic 11/3011 - variable compliance due to incontinence continue BP control and ASA qd Small dose increase x 5d for RLE edema - then resume low dose as ongoing - dtr to call if problems

## 2012-06-29 NOTE — Patient Instructions (Signed)
It was good to see you today. We have reviewed your prior records including labs and tests today Health Maintenance reviewed - flu and tetanus shot today -all recommended immunizations and age-appropriate screenings are up-to-date.  we'll make referral to GI for colon screening. Our office will contact you regarding appointment(s) once made. we'll make referral to [facility] for social worker to help with placement needs . Our office will contact you regarding appointment(s) once made. Increase lasix to 20mg  2x/day x 5 days, then resume once daily - call if continued right leg/knee swelling to consider steroid shot if needed Other Medications reviewed, no other changes at this time. Please schedule followup in 3-4 months, call sooner if problems.  Health Maintenance, Males A healthy lifestyle and preventative care can promote health and wellness.  Maintain regular health, dental, and eye exams.   Eat a healthy diet. Foods like vegetables, fruits, whole grains, low-fat dairy products, and lean protein foods contain the nutrients you need without too many calories. Decrease your intake of foods high in solid fats, added sugars, and salt. Get information about a proper diet from your caregiver, if necessary.   Regular physical exercise is one of the most important things you can do for your health. Most adults should get at least 150 minutes of moderate-intensity exercise (any activity that increases your heart rate and causes you to sweat) each week. In addition, most adults need muscle-strengthening exercises on 2 or more days a week.     Maintain a healthy weight. The body mass index (BMI) is a screening tool to identify possible weight problems. It provides an estimate of body fat based on height and weight. Your caregiver can help determine your BMI, and can help you achieve or maintain a healthy weight. For adults 20 years and older:   A BMI below 18.5 is considered underweight.   A BMI of  18.5 to 24.9 is normal.   A BMI of 25 to 29.9 is considered overweight.   A BMI of 30 and above is considered obese.   Maintain normal blood lipids and cholesterol by exercising and minimizing your intake of saturated fat. Eat a balanced diet with plenty of fruits and vegetables. Blood tests for lipids and cholesterol should begin at age 81 and be repeated every 5 years. If your lipid or cholesterol levels are high, you are over 50, or you are a high risk for heart disease, you may need your cholesterol levels checked more frequently. Ongoing high lipid and cholesterol levels should be treated with medicines, if diet and exercise are not effective.   If you smoke, find out from your caregiver how to quit. If you do not use tobacco, do not start.   If you choose to drink alcohol, do not exceed 2 drinks per day. One drink is considered to be 12 ounces (355 mL) of beer, 5 ounces (148 mL) of wine, or 1.5 ounces (44 mL) of liquor.   Avoid use of street drugs. Do not share needles with anyone. Ask for help if you need support or instructions about stopping the use of drugs.   High blood pressure causes heart disease and increases the risk of stroke. Blood pressure should be checked at least every 1 to 2 years. Ongoing high blood pressure should be treated with medicines if weight loss and exercise are not effective.   If you are 56 to 73 years old, ask your caregiver if you should take aspirin to prevent heart disease.  Diabetes screening involves taking a blood sample to check your fasting blood sugar level. This should be done once every 3 years, after age 52, if you are within normal weight and without risk factors for diabetes. Testing should be considered at a younger age or be carried out more frequently if you are overweight and have at least 1 risk factor for diabetes.   Colorectal cancer can be detected and often prevented. Most routine colorectal cancer screening begins at the age of 9 and  continues through age 67. However, your caregiver may recommend screening at an earlier age if you have risk factors for colon cancer. On a yearly basis, your caregiver may provide home test kits to check for hidden blood in the stool. Use of a small camera at the end of a tube, to directly examine the colon (sigmoidoscopy or colonoscopy), can detect the earliest forms of colorectal cancer. Talk to your caregiver about this at age 64, when routine screening begins. Direct examination of the colon should be repeated every 5 to 10 years through age 22, unless early forms of pre-cancerous polyps or small growths are found.   Hepatitis C blood testing is recommended for all people born from [birth year] through 1967 and any individual with known risks for hepatitis C.   Healthy men should no longer receive prostate-specific antigen (PSA) blood tests as part of routine cancer screening. Consult with your caregiver about prostate cancer screening.   Testicular cancer screening is not recommended for adolescents or adult males who have no symptoms. Screening includes self-exam, caregiver exam, and other screening tests. Consult with your caregiver about any symptoms you have or any concerns you have about testicular cancer.   Practice safe sex. Use condoms and avoid high-risk sexual practices to reduce the spread of sexually transmitted infections (STIs).   Use sunscreen with a sun protection factor (SPF) of 30 or greater. Apply sunscreen liberally and repeatedly throughout the day. You should seek shade when your shadow is shorter than you. Protect yourself by wearing long sleeves, pants, a wide-brimmed hat, and sunglasses year round, whenever you are outdoors.   Notify your caregiver of new moles or changes in moles, especially if there is a change in shape or color. Also notify your caregiver if a mole is larger than the size of a pencil eraser.   A one-time screening for abdominal aortic aneurysm (AAA) and  surgical repair of large AAAs by sound wave imaging (ultrasonography) is recommended for ages 24 to 60 years who are current or former smokers.   Stay current with your immunizations.  Document Released: 03/07/2008 Document Revised: 12/02/2011 Document Reviewed: 02/04/2011 [facility] Patient Information 2013 [place], [place].

## 2012-06-29 NOTE — Assessment & Plan Note (Signed)
Doing reasonably well on remeron and depakote - cont same Prior MRI brain 09/2008 and labs reviewed - no neuro or psyc changes DMV revoke drivers lic 09/28/08 S/p HHPT/OT and aide to assist in care to avoid placement as long as possible HH SW/CM to help dtr with placement needs as they arise

## 2012-06-30 ENCOUNTER — Encounter: Payer: Self-pay | Admitting: Gastroenterology

## 2012-07-21 ENCOUNTER — Ambulatory Visit (AMBULATORY_SURGERY_CENTER): Payer: Medicare Other | Admitting: *Deleted

## 2012-07-21 ENCOUNTER — Telehealth: Payer: Self-pay | Admitting: Internal Medicine

## 2012-07-21 VITALS — Ht 68.0 in | Wt 160.0 lb

## 2012-07-21 DIAGNOSIS — Z1211 Encounter for screening for malignant neoplasm of colon: Secondary | ICD-10-CM

## 2012-07-21 MED ORDER — MOVIPREP 100 G PO SOLR: ORAL | Status: DC

## 2012-07-21 NOTE — Telephone Encounter (Signed)
Request to know status of social work referral, states it has been over 2 weeks and has not heard anything. Please Advise. Daughter states things are getting worse.

## 2012-07-21 NOTE — Telephone Encounter (Signed)
Order placed 10/7 for CM/SW- sent to [facility] who would contact pt per [facility] note - please ask [facility] to follow up on this - thanks

## 2012-07-22 NOTE — Telephone Encounter (Signed)
Forwarding msg to referral coordinators../lmb 

## 2012-07-28 ENCOUNTER — Ambulatory Visit: Payer: Medicare Other | Admitting: Internal Medicine

## 2012-07-28 ENCOUNTER — Telehealth: Payer: Self-pay | Admitting: *Deleted

## 2012-07-28 NOTE — Telephone Encounter (Signed)
 [facility] nurse called-pt is having edema and has retained about 6 pounds of fluid. Pt has taken an extra fluid pill today and yesterday but it is not helping-transferred to scheduler for appointment for today if available.

## 2012-07-29 ENCOUNTER — Ambulatory Visit (INDEPENDENT_AMBULATORY_CARE_PROVIDER_SITE_OTHER): Payer: Medicare Other | Admitting: Internal Medicine

## 2012-07-29 ENCOUNTER — Other Ambulatory Visit (INDEPENDENT_AMBULATORY_CARE_PROVIDER_SITE_OTHER): Payer: Medicare Other

## 2012-07-29 ENCOUNTER — Encounter: Payer: Self-pay | Admitting: Internal Medicine

## 2012-07-29 VITALS — BP 124/62 | HR 64 | Temp 97.0°F | Ht 68.0 in | Wt 168.0 lb

## 2012-07-29 DIAGNOSIS — R0989 Other specified symptoms and signs involving the circulatory and respiratory systems: Secondary | ICD-10-CM

## 2012-07-29 DIAGNOSIS — I5030 Unspecified diastolic (congestive) heart failure: Secondary | ICD-10-CM

## 2012-07-29 DIAGNOSIS — R609 Edema, unspecified: Secondary | ICD-10-CM

## 2012-07-29 DIAGNOSIS — R600 Localized edema: Secondary | ICD-10-CM

## 2012-07-29 LAB — BASIC METABOLIC PANEL
Calcium: 8.9 mg/dL (ref 8.4–10.5)
GFR: 49.44 mL/min — ABNORMAL LOW (ref >60.00)
Potassium: 4.2 mEq/L (ref 3.5–5.1)
Sodium: 142 mEq/L (ref 135–145)

## 2012-07-29 NOTE — Patient Instructions (Signed)
Take 40 mg(2 tablets) daily for 5 days. Monitor weight and then continue 20 mg daily as prescribed. Try to avoid fast food. If eating out at a restaurant, have the chef prepare the meal with as little salt as possible. Only take in up to 2 liters of liquids per day. Thank you for allowing me to spend time with you today. If you have any further questions, please feel free to call the office at any time and leave a message or you can utilize the MyChart features to send me a quick note, and I will get back with you as soon as possible.  Have a wonderful day! Nicki Reaper, NP-C    2 Gram Low Sodium Diet A 2 gram sodium diet restricts the amount of sodium in the diet to no more than 2 g or 2000 mg daily. Limiting the amount of sodium is often used to help lower blood pressure. It is important if you have heart, liver, or kidney problems. Many foods contain sodium for flavor and sometimes as a preservative. When the amount of sodium in a diet needs to be low, it is important to know what to look for when choosing foods and drinks. The following includes some information and guidelines to help make it easier for you to adapt to a low sodium diet. QUICK TIPS  Do not add salt to food.   Avoid convenience items and fast food.   Choose unsalted snack foods.   Buy lower sodium products, often labeled as "lower sodium" or "no salt added."   Check food labels to learn how much sodium is in 1 serving.   When eating at a restaurant, ask that your food be prepared with less salt or none, if possible.  READING FOOD LABELS FOR SODIUM INFORMATION The nutrition facts label is a good place to find how much sodium is in foods. Look for products with no more than 500 to 600 mg of sodium per meal and no more than 150 mg per serving. Remember that 2 g = 2000 mg. The food label may also list foods as:  Sodium-free: Less than 5 mg in a serving.   Very low sodium: 35 mg or less in a serving.   Low-sodium: 140  mg or less in a serving.   Light in sodium: 50% less sodium in a serving. For example, if a food that usually has 300 mg of sodium is changed to become light in sodium, it will have 150 mg of sodium.   Reduced sodium: 25% less sodium in a serving. For example, if a food that usually has 400 mg of sodium is changed to reduced sodium, it will have 300 mg of sodium.  CHOOSING FOODS Grains  Avoid: Salted crackers and snack items. Some cereals, including instant hot cereals. Bread stuffing and biscuit mixes. Seasoned rice or pasta mixes.   Choose: Unsalted snack items. Low-sodium cereals, oats, puffed wheat and rice, shredded wheat. English muffins and bread. Pasta.  Meats  Avoid: Salted, canned, smoked, spiced, pickled meats, including fish and poultry. Bacon, ham, sausage, cold cuts, hot dogs, anchovies.   Choose: Low-sodium canned tuna and salmon. Fresh or frozen meat, poultry, and fish.  Dairy  Avoid: Processed cheese and spreads. Cottage cheese. Buttermilk and condensed milk. Regular cheese.   Choose: Milk. Low-sodium cottage cheese. Yogurt. Sour cream. Low-sodium cheese.  Fruits and Vegetables  Avoid: Regular canned vegetables. Regular canned tomato sauce and paste. Frozen vegetables in sauces. Olives. Rosita Fire. Relishes. Sauerkraut.  Choose: Low-sodium canned vegetables. Low-sodium tomato sauce and paste. Frozen or fresh vegetables. Fresh and frozen fruit.  Condiments  Avoid: Canned and packaged gravies. Worcestershire sauce. Tartar sauce. Barbecue sauce. Soy sauce. Steak sauce. Ketchup. Onion, garlic, and table salt. Meat flavorings and tenderizers.   Choose: Fresh and dried herbs and spices. Low-sodium varieties of mustard and ketchup. Lemon juice. Tabasco sauce. Horseradish.  SAMPLE 2 GRAM SODIUM MEAL PLAN Breakfast / Sodium (mg)  1 cup low-fat milk / 143 mg   2 slices whole-wheat toast / 270 mg   1 tbs heart-healthy margarine / 153 mg   1 hard-boiled egg / 139 mg   1  small orange / 0 mg  Lunch / Sodium (mg)  1 cup raw carrots / 76 mg    cup hummus / 298 mg   1 cup low-fat milk / 143 mg    cup red grapes / 2 mg   1 whole-wheat pita bread / 356 mg  Dinner / Sodium (mg)  1 cup whole-wheat pasta / 2 mg   1 cup low-sodium tomato sauce / 73 mg   3 oz lean ground beef / 57 mg   1 small side salad (1 cup raw spinach leaves,  cup cucumber,  cup yellow bell pepper) with 1 tsp olive oil and 1 tsp red wine vinegar / 25 mg  Snack / Sodium (mg)  1 container low-fat vanilla yogurt / 107 mg   3 graham cracker squares / 127 mg  Nutrient Analysis  Calories: 2033   Protein: 77 g   Carbohydrate: 282 g   Fat: 72 g   Sodium: 1971 mg  Document Released: 09/09/2005 Document Revised: 12/02/2011 Document Reviewed: 12/11/2009 [facility] Patient Information 2013 [place], [place].

## 2012-07-29 NOTE — Progress Notes (Signed)
Subjective:    Patient ID: Erik [name], male    DOB: 1941-08-25, 71 y.o.   MRN: [id number]  HPI  Pt presents to the clinic today with a 8 lb weight gain in the last week. Pt does have a history of diastolic heart failure controlled with 20 mg Lasix daily, 2 gm sodium restricted diet, and 2 L volume restriction daily. Over the past week, pt has eaten pizza, hamburgers, and a whole box of Cheez-it crackers. Pt is not experiencing any difficulty breathing or shortness of breath. He did experience some lower leg edema which has resolved as well as vomiting x 1 that has resolved.  Review of Systems      Past Medical History  Diagnosis Date  . Hypertension   . COPD (chronic obstructive pulmonary disease)   . BPH (benign prostatic hyperplasia)   . Hyperlipidemia   . Dementia, vascular, with delusions   . Mood disorder   . CAD (coronary artery disease)     CABG  . AAA (abdominal aortic aneurysm)   . GERD (gastroesophageal reflux disease)   . Lung cancer 2008    R lung resection  . Alzheimer disease   . CHF (congestive heart failure)   . Depression   . 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 amLODipine (NORVASC) 10 MG tablet Take 1 tablet (10 mg total) by mouth daily.  90 tablet  3  . aspirin 325 MG EC tablet Take 325 mg by mouth daily.        . divalproex (DEPAKOTE) 250 MG DR tablet Take 1 tablet (250 mg total) by mouth 3 (three) times daily.  270 tablet  3  . donepezil (ARICEPT) 5 MG tablet Take 1 tablet (5 mg total) by mouth daily.  90 tablet  3  . esomeprazole (NEXIUM) 40 MG capsule Take 1 capsule (40 mg total) by mouth daily before breakfast.  90 capsule  3  . furosemide (LASIX) 20 MG tablet Take 1 tablet (20 mg total) by mouth daily.  90 tablet  3  . hydrALAZINE (APRESOLINE) 50 MG tablet Take 1 tablet (50 mg total) by mouth 4 (four) times daily.  120 tablet  11  . Ipratropium-Albuterol (COMBIVENT) 20-100  MCG/ACT AERS respimat Inhale 1 puff into the lungs every 6 (six) hours as needed for wheezing.  1 Inhaler  1  . lovastatin (MEVACOR) 40 MG tablet Take 1 tablet (40 mg total) by mouth at bedtime.  90 tablet  3  . methocarbamol (ROBAXIN) 500 MG tablet Take 1 tablet (500 mg total) by mouth 3 (three) times daily as needed (muscle spasm).  60 tablet  5  . mirtazapine (REMERON) 15 MG tablet Take 1 tablet (15 mg total) by mouth at bedtime.  90 tablet  2  . MOVIPREP 100 G SOLR MOVI PREP take as directed no substitution  1 kit  0  . PARoxetine (PAXIL) 20 MG tablet Take 1 tablet (20 mg total) by mouth daily.  90 tablet  3  . Tamsulosin HCl (FLOMAX) 0.4 MG CAPS Take 1 capsule (0.4 mg total) by mouth daily.  90 capsule  2  . traMADol-acetaminophen (ULTRACET) 37.5-325 MG per tablet Take 1 tablet by mouth every 6 (six) hours as needed for pain.  30 tablet  1    Allergies  Allergen Reactions  . Hydrochlorothiazide Nausea And Vomiting and Rash    Family History  Problem Relation  Age of Onset  . Arthritis Mother   . Arthritis Father   . Arthritis Maternal Grandmother   . Arthritis Maternal Grandfather   . Breast cancer Other   . Stroke Other     Parent not sure which 1    History   Social History  . Marital Status: Divorced    Spouse Name: N/A    Number of Children: N/A  . Years of Education: N/A   Occupational History  . Not on file.   Social History Main Topics  . Smoking status: Former Smoker -- 0 years  . Smokeless tobacco: Never Used     Comment: supervised by dtr  . Alcohol Use: No  . Drug Use: No  . Sexually Active:    Other Topics Concern  . Not on file   Social History Narrative   Lives with son and also gets support from daughter nearby. He is separated, and still check on his ex-wife per dtr. I ADLs- walk w/cane & leg brace    Constitutional: Positive for abrupt weigh tgain. Denies fever, malaise, fatigue, headache.  Respiratory: Denies difficulty breathing, shortness  of breath, cough or sputum production.   Cardiovascular: Denies chest pain, chest tightness, palpitations or swelling in the hands or feet.  Gastrointestinal: Denies abdominal pain, bloating, constipation, diarrhea or blood in the stool.   No other specific complaints in a complete review of systems (except as listed in HPI above).  Objective:   Physical Exam  BP 124/62  Pulse 64  Temp 97 F (36.1 C) (Oral)  Ht 5\' 8"  (1.727 m)  Wt 168 lb (76.204 kg)  BMI 25.54 kg/m2  SpO2 97% Wt Readings from Last 3 Encounters:  07/29/12 168 lb (76.204 kg)  07/21/12 160 lb (72.576 kg)  06/29/12 163 lb (73.936 kg)    General: Appears his stated age, well developed, well nourished in NAD. Cardiovascular: Normal rate and rhythm. S1,S2 noted.  No murmur, rubs or gallops noted. Mild JVD present bilaterally. No BLE edema. No carotid bruits noted. Pulmonary/Chest: Normal effort and positive vesicular breath sounds. No respiratory distress. No wheezes, rales or ronchi noted.  Abdomen: Soft and nontender. Normal bowel sounds, no bruits noted. No distention or masses noted. Liver, spleen and kidneys non palpable.     Assessment & Plan:   Abrupt weight gain secondary to diet Diastolic heart failure, chronic condition with additional workup required  Will obtain a BMET to assess sodium level and kidney function. Increase Lasix to 40 mg for 3 days, then return to taking 20 mg daily Continue to monitor weight daily. Continue 2 gram sodium restriction and 2 liter volume restriction, information provided in handout.  RTC in 1 week for weight check.

## 2012-07-30 ENCOUNTER — Telehealth: Payer: Self-pay | Admitting: Internal Medicine

## 2012-07-30 NOTE — Telephone Encounter (Signed)
Hospital doctor, Charity fundraiser at [employer].  Asking for extension of orders x 4 weeks with a frequency 2 week /3 to start on 11/11.  Patient has gained weight and had questions about what to do.  Feels that he  needs CHF education.   Please fax to [phone].

## 2012-07-30 NOTE — Telephone Encounter (Signed)
Verbal ok. thanks

## 2012-07-31 NOTE — Telephone Encounter (Signed)
Interior and spatial designer informed of verbal order.

## 2012-08-05 ENCOUNTER — Encounter: Payer: Self-pay | Admitting: Gastroenterology

## 2012-08-05 ENCOUNTER — Ambulatory Visit (AMBULATORY_SURGERY_CENTER): Payer: Medicare Other | Admitting: Gastroenterology

## 2012-08-05 VITALS — BP 147/72 | HR 54 | Temp 97.3°F | Resp 19 | Ht 68.0 in | Wt 168.0 lb

## 2012-08-05 DIAGNOSIS — D126 Benign neoplasm of colon, unspecified: Secondary | ICD-10-CM

## 2012-08-05 DIAGNOSIS — Z1211 Encounter for screening for malignant neoplasm of colon: Secondary | ICD-10-CM

## 2012-08-05 MED ORDER — SODIUM CHLORIDE 0.9 % IV SOLN: 500.0000 mL | INTRAVENOUS | Status: DC

## 2012-08-05 NOTE — Op Note (Signed)
[facility] [address] [place], [address]   COLONOSCOPY PROCEDURE REPORT  PATIENT: Erik [name], Erik [name]  MR#: [id number] BIRTHDATE: 1941-05-13 , 71  yrs. old GENDER: Male ENDOSCOPIST: Mardella Layman, MD, [facility] REFERRED BY:  Loreen Freud, DO PROCEDURE DATE:  08/05/2012 PROCEDURE: ASA CLASS:   Class III INDICATIONS:average risk patient for colon cancer. MEDICATIONS: propofol (Diprivan) 150mg  IV  DESCRIPTION OF PROCEDURE:   After the risks and benefits and of the procedure were explained, informed consent was obtained.  A digital rectal exam revealed external hemorrhoids.    The LB [id number]  endoscope was introduced through the anus and advanced to the cecum, which was identified by both the appendix and ileocecal valve .  The quality of the prep was excellent, using MoviPrep . The instrument was then slowly withdrawn as the colon was fully examined.     COLON FINDINGS: A smooth flat polyp ranging between 3-61mm in size was found in the sigmoid colon.  A polypectomy was performed using snare cautery.  The resection was complete and the polyp tissue was completely retrieved.   The colon mucosa was otherwise normal. Retroflexed views revealed external hemorrhoids.     The scope was then withdrawn from the patient and the procedure completed.  COMPLICATIONS: There were no complications. ENDOSCOPIC IMPRESSION: 1.   Flat polyp ranging between 3-43mm in size was found in the sigmoid colon; polypectomy was performed using snare cautery 2.   The colon mucosa was otherwise normal 3.  External hemorrhoids RECOMMENDATIONS: 1.  High fiber diet 2.  Continue current medications   REPEAT EXAM:  cc:  _______________________________ eSignedMardella Layman, MD, [facility] 08/05/2012 10:47 AM

## 2012-08-05 NOTE — Progress Notes (Signed)
Patient did not experience any of the following events: a burn prior to discharge; a fall within the facility; wrong site/side/patient/procedure/implant event; or a hospital transfer or hospital admission upon discharge from the facility. (G8907) Patient did not have preoperative order for IV antibiotic SSI prophylaxis. (G8918)  

## 2012-08-05 NOTE — Patient Instructions (Addendum)
Impressions/Recommendations:  Polyp (handout given) Hemorrhoids (handout given)  High Fiber Diet (handout given)  Repeat colonoscopy will be determined after pathology results received.  YOU HAD AN ENDOSCOPIC PROCEDURE TODAY AT THE [facility]: Refer to the procedure report that was given to you for any specific questions about what was found during the examination.  If the procedure report does not answer your questions, please call your gastroenterologist to clarify.  If you requested that your care partner not be given the details of your procedure findings, then the procedure report has been included in a sealed envelope for you to review at your convenience later.  YOU SHOULD EXPECT: Some feelings of bloating in the abdomen. Passage of more gas than usual.  Walking can help get rid of the air that was put into your GI tract during the procedure and reduce the bloating. If you had a lower endoscopy (such as a colonoscopy or flexible sigmoidoscopy) you may notice spotting of blood in your stool or on the toilet paper. If you underwent a bowel prep for your procedure, then you may not have a normal bowel movement for a few days.  DIET: Your first meal following the procedure should be a light meal and then it is ok to progress to your normal diet.  A half-sandwich or bowl of soup is an example of a good first meal.  Heavy or fried foods are harder to digest and may make you feel nauseous or bloated.  Likewise meals heavy in dairy and vegetables can cause extra gas to form and this can also increase the bloating.  Drink plenty of fluids but you should avoid alcoholic beverages for 24 hours.  ACTIVITY: Your care partner should take you home directly after the procedure.  You should plan to take it easy, moving slowly for the rest of the day.  You can resume normal activity the day after the procedure however you should NOT DRIVE or use heavy machinery for 24 hours (because of the sedation  medicines used during the test).    SYMPTOMS TO REPORT IMMEDIATELY: A gastroenterologist can be reached at any hour.  During normal business hours, 8:30 AM to 5:00 PM Monday through Friday, call [phone].  After hours and on weekends, please call the GI answering service at [phone] who will take a message and have the physician on call contact you.   Following lower endoscopy (colonoscopy or flexible sigmoidoscopy):  Excessive amounts of blood in the stool  Significant tenderness or worsening of abdominal pains  Swelling of the abdomen that is new, acute  Fever of 100F or higher   FOLLOW UP: If any biopsies were taken you will be contacted by phone or by letter within the next 1-3 weeks.  Call your gastroenterologist if you have not heard about the biopsies in 3 weeks.  Our staff will call the home number listed on your records the next business day following your procedure to check on you and address any questions or concerns that you may have at that time regarding the information given to you following your procedure. This is a courtesy call and so if there is no answer at the home number and we have not heard from you through the emergency physician on call, we will assume that you have returned to your regular daily activities without incident.  SIGNATURES/CONFIDENTIALITY: You and/or your care partner have signed paperwork which will be entered into your electronic medical record.  These signatures attest to  the fact that that the information above on your After Visit Summary has been reviewed and is understood.  Full responsibility of the confidentiality of this discharge information lies with you and/or your care-partner.

## 2012-08-06 ENCOUNTER — Telehealth: Payer: Self-pay | Admitting: *Deleted

## 2012-08-06 NOTE — Telephone Encounter (Signed)
  Follow up Call-  Call back number 08/05/2012  Post procedure Call Back phone  # [phone] SPEAK WITH DAUGHTER  Permission to leave phone message Yes     No answer on patient's daughter # listed. Message left.

## 2012-08-11 ENCOUNTER — Encounter: Payer: Self-pay | Admitting: Gastroenterology

## 2012-08-12 DIAGNOSIS — Z0279 Encounter for issue of other medical certificate: Secondary | ICD-10-CM

## 2012-08-13 ENCOUNTER — Telehealth: Payer: Self-pay | Admitting: *Deleted

## 2012-08-13 MED ORDER — FUROSEMIDE 40 MG PO TABS: 40.0000 mg | ORAL_TABLET | Freq: Every day | ORAL | Status: AC

## 2012-08-13 NOTE — Telephone Encounter (Signed)
Notified Erik [name] with md response.../lmb 

## 2012-08-13 NOTE — Telephone Encounter (Signed)
Wanted to inform md pt has 6 lbs weight gain since Monday. Today he has 2 plus edema lower extremity...lmb

## 2012-08-13 NOTE — Telephone Encounter (Signed)
Increase lasix to 40mg  daily, every day

## 2012-08-21 ENCOUNTER — Other Ambulatory Visit: Payer: Self-pay | Admitting: Internal Medicine

## 2012-08-25 ENCOUNTER — Telehealth: Payer: Self-pay | Admitting: *Deleted

## 2012-08-25 NOTE — Telephone Encounter (Signed)
Ok for verbal 

## 2012-08-25 NOTE — Telephone Encounter (Signed)
MD is out of office. Pls advise.../lmb 

## 2012-08-25 NOTE — Telephone Encounter (Signed)
Left msg on vm Friday 08/15/12 & Monday 08/23/12. Requesting verbal re-certification order to continue going out to see patient few more weeks. Family planning on putting him in a nursing home. He is still having difficulty with his swelling...Raechel Chute

## 2012-08-25 NOTE — Telephone Encounter (Signed)
HHRN informed 

## 2012-09-01 ENCOUNTER — Encounter: Payer: Self-pay | Admitting: Internal Medicine

## 2012-09-01 ENCOUNTER — Ambulatory Visit (INDEPENDENT_AMBULATORY_CARE_PROVIDER_SITE_OTHER): Payer: Medicare Other | Admitting: Internal Medicine

## 2012-09-01 VITALS — BP 142/62 | HR 72 | Temp 97.7°F | Ht 68.0 in | Wt 167.4 lb

## 2012-09-01 DIAGNOSIS — I5032 Chronic diastolic (congestive) heart failure: Secondary | ICD-10-CM

## 2012-09-01 DIAGNOSIS — F0152 Vascular dementia, unspecified severity, with psychotic disturbance: Secondary | ICD-10-CM

## 2012-09-01 DIAGNOSIS — M545 Low back pain, unspecified: Secondary | ICD-10-CM

## 2012-09-01 DIAGNOSIS — I10 Essential (primary) hypertension: Secondary | ICD-10-CM

## 2012-09-01 DIAGNOSIS — F0151 Vascular dementia with behavioral disturbance: Secondary | ICD-10-CM

## 2012-09-01 DIAGNOSIS — F22 Delusional disorders: Secondary | ICD-10-CM

## 2012-09-01 MED ORDER — TIZANIDINE HCL 2 MG PO TABS: 2.0000 mg | ORAL_TABLET | Freq: Four times a day (QID) | ORAL | Status: DC | PRN

## 2012-09-01 NOTE — Patient Instructions (Signed)
It was good to see you today. We have reviewed your prior records including labs and tests today Medications reviewed, stop Robaxin and begin tizanidine for muscle relaxer to help back pain -Your prescription(s) have been submitted to your pharmacy. Please take as directed and contact our office if you believe you are having problem(s) with the medication(s). Will complete FL2 and call you when ready for pick up Please schedule followup in 4-6 months, call sooner if problems.

## 2012-09-01 NOTE — Assessment & Plan Note (Signed)
progressive DOE - multifactorial  Reviewed last echo 07/2011 and negative myoview 03/2012 Started low dose diuretic 11/3011 - variable compliance due to incontinence and inadequate home supervision at present titration and daily weights at home ongoing - improved edema today Reminded need for low Na - expect to improve once in SNF

## 2012-09-01 NOTE — Assessment & Plan Note (Signed)
Chronic symptoms - at baseline change robaxin to formulary preferred tizanidine

## 2012-09-01 NOTE — Assessment & Plan Note (Signed)
The current medical regimen is effective;  continue present plan and medications.  BP Readings from Last 3 Encounters:  09/01/12 142/62  08/05/12 147/72  07/29/12 124/62

## 2012-09-01 NOTE — Assessment & Plan Note (Signed)
Doing reasonably well on aricept, remeron and depakote - cont same Prior MRI brain 09/2008 and labs reviewed - no neuro or psyc changes DMV revoke drivers lic 4/0/98 S/p HHPT/OT and aide to assist in care [facility] SW/CM ongoing to help dtr with placement needs - FL2 to be signed for pending SNF placement

## 2012-09-01 NOTE — Progress Notes (Signed)
Subjective:    Patient ID: Erik [name], male    DOB: 29-Oct-1940, 71 y.o.   MRN: [id number]  HPI  Here for follow up - see CC Also reviewed chronic medical issues:  Dementia -on remeron and depakote - continued fluctations in eratic behavior - dtr's letter stating concerns scanned into EMR 10/2010 OV unable to complete cognitive eval 06/2010 with [facility] due to lack of participation with testing DMV revoked driving lic 4/0/98 Dtr requests asst to possible SNF placement (bro leaving pt unattended) -needs FL-2 signed  hypertension - reports compliance with ongoing medical treatment and no changes in medication dose or frequency. denies adverse side effects related to current therapy.  no chest pain or dizziness  Mood disorder, see dementia above -reports compliance with ongoing medical treatment and no changes in medication dose or frequency. denies adverse side effects related to current therapy. less irritable per family, no loss of appetite and less onging weight loss - denies sadness.  dyslipidemia - reports compliance with ongoing medical treatment and no changes in medication dose or frequency. denies adverse side effects related to current therapy. no GI or muscle problems   Past Medical History  Diagnosis Date  . Hypertension   . COPD (chronic obstructive pulmonary disease)   . BPH (benign prostatic hyperplasia)   . Hyperlipidemia   . Dementia, vascular, with delusions   . Mood disorder   . CAD (coronary artery disease)     CABG  . AAA (abdominal aortic aneurysm)   . GERD (gastroesophageal reflux disease)   . Lung cancer 2008    R lung resection  . Alzheimer disease   . CHF (congestive heart failure)   . Depression   . 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 Negative for fever and unexpected weight change.  Respiratory: Positive for shortness of breath.  Negative for cough.   Cardiovascular: Negative for chest pain, palpitations and leg swelling.  Neurological: Negative for seizures, weakness and headaches.        Objective:   Physical Exam  BP 142/62  Pulse 72  Temp 97.7 F (36.5 C) (Oral)  Ht 5\' 8"  (1.727 m)  Wt 167 lb 6.4 oz (75.932 kg)  BMI 25.45 kg/m2  SpO2 96% Wt Readings from Last 3 Encounters:  09/01/12 167 lb 6.4 oz (75.932 kg)  08/05/12 168 lb (76.204 kg)  07/29/12 168 lb (76.204 kg)   Constitutional: appears well-developed and well-nourished. No distress.  Dtr at side Neck: Normal range of motion. Neck supple. No JVD present. No thyromegaly present.  Cardiovascular: Normal rate, regular rhythm and normal heart sounds.  No murmur heard. RLE 1+ edema compared to trace on LLE Pulmonary/Chest: Effort normal and breath sounds normal. No respiratory distress. no wheezes.  Abdomen: soft, nontender, +BS Neurological: he is alert and oriented to person, place, and time. No cranial nerve deficit. Coordination normal. chronic LHP L>UE, walks with cane asst Psychiatric: he has a eccentric expansive mood and affect. Tangential though - hyperactive behavior. Judgement impaired.  Lab Results  Component Value Date   WBC 7.4 04/24/2012   HGB 11.6* 04/24/2012   HCT 35.7* 04/24/2012   PLT 176 04/24/2012   GLUCOSE 90 07/29/2012   CHOL 132 12/06/2011   TRIG 78.0 12/06/2011   HDL 41.40 12/06/2011   LDLCALC 75 12/06/2011   ALT 33 04/22/2012   AST 28 04/22/2012  NA 142 07/29/2012   K 4.2 07/29/2012   CL 106 07/29/2012   CREATININE 1.5 07/29/2012   BUN 21 07/29/2012   CO2 29 07/29/2012   TSH 2.082 04/22/2012   HGBA1C  Value: 6.0 (NOTE)   The ADA recommends the following therapeutic goal for glycemic   control related to Hgb A1C measurement:   Goal of Therapy:   < 7.0% Hgb A1C   Reference: American Diabetes Association: Clinical Practice   Recommendations 2008, Diabetes Care,  2008, 31:(Suppl 1). 10/05/2008    MRI brain 10/06/2008 reviewed - no  acute, no mass - chronic vasc changes  2D echo 08/13/11: Study Conclusions  - Left ventricle: The cavity size was normal. Wall thickness   was increased in a pattern of mild LVH. Systolic function   was normal. The estimated ejection fraction was in the   range of 55% to 60%. Wall motion was normal; there were no   regional wall motion abnormalities. Doppler parameters are   consistent with abnormal left ventricular relaxation   (grade 1 diastolic dysfunction). - Pulmonary arteries: Systolic pressure was mildly   increased.   Myoview 03/2012 neg for ischemia   Assessment & Plan:   see problem list. Medications and labs reviewed today.  ?vomitting - pt reports this has now resolved and dtr denies awareness of same symptoms GI exam and VS benign - no further eval for ?GE symptoms which appear resolved  Time spent with pt/family today 25 minutes, greater than 50% time spent counseling patient on chronic med issues, medication review and completion of FL2 for SNF placement. Also review of prior records

## 2012-09-06 ENCOUNTER — Other Ambulatory Visit: Payer: Self-pay | Admitting: Internal Medicine

## 2012-09-08 ENCOUNTER — Telehealth: Payer: Self-pay | Admitting: *Deleted

## 2012-09-08 NOTE — Telephone Encounter (Signed)
Wanted to inform md out seeing pt today. Pt has swelling in (L) extremities, Per family been SOB in last 24 hours, also want to inform md pt fell out of bed last night have some soreness on (R) flank...Raechel Chute

## 2012-09-08 NOTE — Telephone Encounter (Signed)
Noted -  OV ok if needed for persisting R flank pain (any acute)  Can increase lasix to 40mg  bid x 5 days, then resume 40mg  qd for edema and SOB

## 2012-09-09 NOTE — Telephone Encounter (Signed)
Called Beth again still no answer. Called office # @ [phone] spoke with Morrie Sheldon. Requesting order to be fax inform Morrie Sheldon order are under phone note. Faxing to [phone]...lmb

## 2012-09-09 NOTE — Telephone Encounter (Signed)
Called Beth no answer LMOM RTC.../lmb 

## 2012-09-22 ENCOUNTER — Telehealth: Payer: Self-pay | Admitting: *Deleted

## 2012-09-22 NOTE — Telephone Encounter (Signed)
Pt is being admitted toi there facility today. MD has filled out FL2 form but is needing more physical & history on pt. Requesting last ov notes/ labs fax to [phone]. Inform angela will fax...Raechel Chute

## 2012-10-08 ENCOUNTER — Emergency Department (HOSPITAL_COMMUNITY): Payer: Medicare Other

## 2012-10-08 ENCOUNTER — Encounter (HOSPITAL_COMMUNITY): Payer: Self-pay | Admitting: Emergency Medicine

## 2012-10-08 ENCOUNTER — Observation Stay (HOSPITAL_COMMUNITY)
Admission: EM | Admit: 2012-10-08 | Discharge: 2012-10-09 | Disposition: A | Discharge status: Still In Hospital | Payer: Medicare Other | Attending: Emergency Medicine | Admitting: Emergency Medicine

## 2012-10-08 DIAGNOSIS — R062 Wheezing: Secondary | ICD-10-CM | POA: Insufficient documentation

## 2012-10-08 DIAGNOSIS — R509 Fever, unspecified: Secondary | ICD-10-CM

## 2012-10-08 DIAGNOSIS — W010XXA Fall on same level from slipping, tripping and stumbling without subsequent striking against object, initial encounter: Secondary | ICD-10-CM | POA: Insufficient documentation

## 2012-10-08 DIAGNOSIS — N183 Chronic kidney disease, stage 3 unspecified: Secondary | ICD-10-CM

## 2012-10-08 DIAGNOSIS — Z7902 Long term (current) use of antithrombotics/antiplatelets: Secondary | ICD-10-CM | POA: Insufficient documentation

## 2012-10-08 DIAGNOSIS — S0181XA Laceration without foreign body of other part of head, initial encounter: Secondary | ICD-10-CM

## 2012-10-08 DIAGNOSIS — F22 Delusional disorders: Secondary | ICD-10-CM

## 2012-10-08 DIAGNOSIS — R0602 Shortness of breath: Secondary | ICD-10-CM

## 2012-10-08 DIAGNOSIS — M545 Low back pain, unspecified: Secondary | ICD-10-CM

## 2012-10-08 DIAGNOSIS — W19XXXA Unspecified fall, initial encounter: Secondary | ICD-10-CM

## 2012-10-08 DIAGNOSIS — I714 Abdominal aortic aneurysm, without rupture, unspecified: Secondary | ICD-10-CM

## 2012-10-08 DIAGNOSIS — I679 Cerebrovascular disease, unspecified: Secondary | ICD-10-CM

## 2012-10-08 DIAGNOSIS — I2510 Atherosclerotic heart disease of native coronary artery without angina pectoris: Secondary | ICD-10-CM

## 2012-10-08 DIAGNOSIS — Z79899 Other long term (current) drug therapy: Secondary | ICD-10-CM | POA: Insufficient documentation

## 2012-10-08 DIAGNOSIS — S0180XA Unspecified open wound of other part of head, initial encounter: Secondary | ICD-10-CM | POA: Insufficient documentation

## 2012-10-08 DIAGNOSIS — J4489 Other specified chronic obstructive pulmonary disease: Secondary | ICD-10-CM

## 2012-10-08 DIAGNOSIS — C3490 Malignant neoplasm of unspecified part of unspecified bronchus or lung: Secondary | ICD-10-CM

## 2012-10-08 DIAGNOSIS — K219 Gastro-esophageal reflux disease without esophagitis: Secondary | ICD-10-CM

## 2012-10-08 DIAGNOSIS — Y9210 Unspecified residential institution as the place of occurrence of the external cause: Secondary | ICD-10-CM | POA: Insufficient documentation

## 2012-10-08 DIAGNOSIS — I129 Hypertensive chronic kidney disease with stage 1 through stage 4 chronic kidney disease, or unspecified chronic kidney disease: Secondary | ICD-10-CM | POA: Insufficient documentation

## 2012-10-08 DIAGNOSIS — F0152 Vascular dementia, unspecified severity, with psychotic disturbance: Secondary | ICD-10-CM

## 2012-10-08 DIAGNOSIS — S0083XA Contusion of other part of head, initial encounter: Secondary | ICD-10-CM

## 2012-10-08 DIAGNOSIS — N400 Enlarged prostate without lower urinary tract symptoms: Secondary | ICD-10-CM

## 2012-10-08 DIAGNOSIS — R269 Unspecified abnormalities of gait and mobility: Secondary | ICD-10-CM

## 2012-10-08 DIAGNOSIS — I5032 Chronic diastolic (congestive) heart failure: Secondary | ICD-10-CM

## 2012-10-08 DIAGNOSIS — N401 Enlarged prostate with lower urinary tract symptoms: Secondary | ICD-10-CM

## 2012-10-08 DIAGNOSIS — E875 Hyperkalemia: Secondary | ICD-10-CM

## 2012-10-08 DIAGNOSIS — J449 Chronic obstructive pulmonary disease, unspecified: Secondary | ICD-10-CM

## 2012-10-08 DIAGNOSIS — I219 Acute myocardial infarction, unspecified: Secondary | ICD-10-CM

## 2012-10-08 DIAGNOSIS — E785 Hyperlipidemia, unspecified: Secondary | ICD-10-CM

## 2012-10-08 DIAGNOSIS — N179 Acute kidney failure, unspecified: Secondary | ICD-10-CM

## 2012-10-08 DIAGNOSIS — Z7982 Long term (current) use of aspirin: Secondary | ICD-10-CM | POA: Insufficient documentation

## 2012-10-08 DIAGNOSIS — I10 Essential (primary) hypertension: Secondary | ICD-10-CM

## 2012-10-08 DIAGNOSIS — N138 Other obstructive and reflux uropathy: Secondary | ICD-10-CM

## 2012-10-08 DIAGNOSIS — H9190 Unspecified hearing loss, unspecified ear: Secondary | ICD-10-CM

## 2012-10-08 DIAGNOSIS — G309 Alzheimer's disease, unspecified: Secondary | ICD-10-CM | POA: Insufficient documentation

## 2012-10-08 DIAGNOSIS — F0280 Dementia in other diseases classified elsewhere without behavioral disturbance: Secondary | ICD-10-CM | POA: Insufficient documentation

## 2012-10-08 DIAGNOSIS — F39 Unspecified mood [affective] disorder: Secondary | ICD-10-CM

## 2012-10-08 DIAGNOSIS — I69959 Hemiplegia and hemiparesis following unspecified cerebrovascular disease affecting unspecified side: Secondary | ICD-10-CM | POA: Insufficient documentation

## 2012-10-08 DIAGNOSIS — J40 Bronchitis, not specified as acute or chronic: Principal | ICD-10-CM

## 2012-10-08 LAB — CBC WITH DIFFERENTIAL/PLATELET
Basophils Absolute: 0 10*3/uL (ref 0.0–0.1)
Eosinophils Relative: 1 % (ref 0–5)
HCT: 38.6 % — ABNORMAL LOW (ref 39.0–52.0)
Hemoglobin: 12.7 g/dL — ABNORMAL LOW (ref 13.0–17.0)
Lymphocytes Relative: 10 % — ABNORMAL LOW (ref 12–46)
Lymphs Abs: 0.9 10*3/uL (ref 0.7–4.0)
MCV: 87.5 fL (ref 78.0–100.0)
Monocytes Absolute: 1.5 10*3/uL — ABNORMAL HIGH (ref 0.1–1.0)
Monocytes Relative: 16 % — ABNORMAL HIGH (ref 3–12)
Neutro Abs: 6.5 10*3/uL (ref 1.7–7.7)
RDW: 14.9 % (ref 11.5–15.5)
WBC: 9 10*3/uL (ref 4.0–10.5)

## 2012-10-08 LAB — COMPREHENSIVE METABOLIC PANEL
AST: 23 U/L (ref 0–37)
BUN: 33 mg/dL — ABNORMAL HIGH (ref 6–23)
CO2: 22 mEq/L (ref 19–32)
Calcium: 8.5 mg/dL (ref 8.4–10.5)
Chloride: 95 mEq/L — ABNORMAL LOW (ref 96–112)
Creatinine, Ser: 1.9 mg/dL — ABNORMAL HIGH (ref 0.50–1.35)
GFR calc Af Amer: 39 mL/min — ABNORMAL LOW (ref >90)
GFR calc non Af Amer: 33 mL/min — ABNORMAL LOW (ref >90)
Glucose, Bld: 116 mg/dL — ABNORMAL HIGH (ref 70–99)
Total Bilirubin: 0.2 mg/dL — ABNORMAL LOW (ref 0.3–1.2)

## 2012-10-08 LAB — PRO B NATRIURETIC PEPTIDE: Pro B Natriuretic peptide (BNP): 178.8 pg/mL — ABNORMAL HIGH (ref 0–125)

## 2012-10-08 MED ORDER — IPRATROPIUM BROMIDE 0.02 % IN SOLN
0.5000 mg | Freq: Once | RESPIRATORY_TRACT | Status: AC
  Administered 2012-10-08: 0.5 mg via RESPIRATORY_TRACT
  Filled 2012-10-08: qty 2.5

## 2012-10-08 MED ORDER — ALBUTEROL SULFATE (5 MG/ML) 0.5% IN NEBU
5.0000 mg | INHALATION_SOLUTION | Freq: Once | RESPIRATORY_TRACT | Status: AC
  Administered 2012-10-08: 5 mg via RESPIRATORY_TRACT
  Filled 2012-10-08 (×2): qty 1

## 2012-10-08 MED ORDER — ACETAMINOPHEN 325 MG PO TABS
650.0000 mg | ORAL_TABLET | Freq: Once | ORAL | Status: AC
  Administered 2012-10-08: 650 mg via ORAL
  Filled 2012-10-08: qty 2

## 2012-10-08 MED ORDER — PREDNISONE 20 MG PO TABS
60.0000 mg | ORAL_TABLET | Freq: Once | ORAL | Status: AC
  Administered 2012-10-08: 60 mg via ORAL
  Filled 2012-10-08: qty 3

## 2012-10-08 MED ORDER — ALBUTEROL SULFATE (5 MG/ML) 0.5% IN NEBU
5.0000 mg | INHALATION_SOLUTION | Freq: Once | RESPIRATORY_TRACT | Status: AC
  Administered 2012-10-08: 5 mg via RESPIRATORY_TRACT
  Filled 2012-10-08: qty 40

## 2012-10-08 NOTE — ED Notes (Signed)
To ED via [facility] from [facility], for fall, allegedly out of bed. Found on floor by nursing home staff- in bed when [facility] arrived. Normally mobile in w/c.
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
## 2012-10-09 ENCOUNTER — Ambulatory Visit: Payer: Medicare Other | Admitting: Cardiology

## 2012-10-09 DIAGNOSIS — J40 Bronchitis, not specified as acute or chronic: Secondary | ICD-10-CM

## 2012-10-09 DIAGNOSIS — S0181XA Laceration without foreign body of other part of head, initial encounter: Secondary | ICD-10-CM

## 2012-10-09 DIAGNOSIS — S0083XA Contusion of other part of head, initial encounter: Secondary | ICD-10-CM

## 2012-10-09 LAB — BASIC METABOLIC PANEL
BUN: 40 mg/dL — ABNORMAL HIGH (ref 6–23)
Calcium: 8.3 mg/dL — ABNORMAL LOW (ref 8.4–10.5)
GFR calc Af Amer: 37 mL/min — ABNORMAL LOW (ref >90)
GFR calc non Af Amer: 32 mL/min — ABNORMAL LOW (ref >90)
Glucose, Bld: 305 mg/dL — ABNORMAL HIGH (ref 70–99)

## 2012-10-09 LAB — URINALYSIS, ROUTINE W REFLEX MICROSCOPIC
Bilirubin Urine: NEGATIVE
Glucose, UA: NEGATIVE mg/dL
Hgb urine dipstick: NEGATIVE
Protein, ur: NEGATIVE mg/dL
Urobilinogen, UA: 0.2 mg/dL (ref 0.0–1.0)

## 2012-10-09 LAB — INFLUENZA PANEL BY PCR (TYPE A & B)
Influenza A By PCR: NEGATIVE
Influenza B By PCR: NEGATIVE

## 2012-10-09 LAB — CBC
MCH: 28.4 pg (ref 26.0–34.0)
MCHC: 32.5 g/dL (ref 30.0–36.0)
Platelets: 168 10*3/uL (ref 150–400)
RDW: 15 % (ref 11.5–15.5)

## 2012-10-09 LAB — GLUCOSE, CAPILLARY
Glucose-Capillary: 201 mg/dL — ABNORMAL HIGH (ref 70–99)
Glucose-Capillary: 115 mg/dL — ABNORMAL HIGH (ref 70–99)

## 2012-10-09 LAB — VALPROIC ACID LEVEL: Valproic Acid Lvl: 32.1 ug/mL — ABNORMAL LOW (ref 50.0–100.0)

## 2012-10-09 MED ORDER — ALBUTEROL (5 MG/ML) CONTINUOUS INHALATION SOLN
10.0000 mg/h | INHALATION_SOLUTION | RESPIRATORY_TRACT | Status: AC
  Administered 2012-10-09: 10 mg/h via RESPIRATORY_TRACT
  Filled 2012-10-09: qty 20

## 2012-10-09 MED ORDER — BUDESONIDE 0.25 MG/2ML IN SUSP
0.2500 mg | Freq: Two times a day (BID) | RESPIRATORY_TRACT | Status: DC
  Administered 2012-10-09: 0.25 mg via RESPIRATORY_TRACT
  Filled 2012-10-09 (×3): qty 2

## 2012-10-09 MED ORDER — ALBUTEROL SULFATE (5 MG/ML) 0.5% IN NEBU: 2.5000 mg | INHALATION_SOLUTION | RESPIRATORY_TRACT | Status: DC | PRN

## 2012-10-09 MED ORDER — TRAMADOL-ACETAMINOPHEN 37.5-325 MG PO TABS: 1.0000 | ORAL_TABLET | Freq: Four times a day (QID) | ORAL | Status: DC | PRN

## 2012-10-09 MED ORDER — ACETAMINOPHEN 650 MG RE SUPP: 650.0000 mg | Freq: Four times a day (QID) | RECTAL | Status: DC | PRN

## 2012-10-09 MED ORDER — ONDANSETRON HCL 4 MG/2ML IJ SOLN: 4.0000 mg | Freq: Four times a day (QID) | INTRAMUSCULAR | Status: DC | PRN

## 2012-10-09 MED ORDER — HYDRALAZINE HCL 50 MG PO TABS
50.0000 mg | ORAL_TABLET | Freq: Four times a day (QID) | ORAL | Status: DC
  Administered 2012-10-09 (×2): 50 mg via ORAL
  Filled 2012-10-09 (×4): qty 1

## 2012-10-09 MED ORDER — DOCUSATE SODIUM 100 MG PO CAPS
200.0000 mg | ORAL_CAPSULE | Freq: Every day | ORAL | Status: DC
  Filled 2012-10-09: qty 2

## 2012-10-09 MED ORDER — ALBUTEROL SULFATE (5 MG/ML) 0.5% IN NEBU
2.5000 mg | INHALATION_SOLUTION | Freq: Four times a day (QID) | RESPIRATORY_TRACT | Status: DC
  Administered 2012-10-09 (×2): 2.5 mg via RESPIRATORY_TRACT
  Filled 2012-10-09 (×3): qty 0.5

## 2012-10-09 MED ORDER — SODIUM CHLORIDE 0.9 % IJ SOLN
3.0000 mL | Freq: Two times a day (BID) | INTRAMUSCULAR | Status: DC
  Administered 2012-10-09 (×2): 3 mL via INTRAVENOUS

## 2012-10-09 MED ORDER — TAMSULOSIN HCL 0.4 MG PO CAPS
0.4000 mg | ORAL_CAPSULE | Freq: Every day | ORAL | Status: DC
  Administered 2012-10-09: 0.4 mg via ORAL
  Filled 2012-10-09: qty 1

## 2012-10-09 MED ORDER — IPRATROPIUM BROMIDE 0.02 % IN SOLN
0.5000 mg | Freq: Four times a day (QID) | RESPIRATORY_TRACT | Status: DC
  Administered 2012-10-09 (×2): 0.5 mg via RESPIRATORY_TRACT
  Filled 2012-10-09 (×3): qty 2.5

## 2012-10-09 MED ORDER — LEVOFLOXACIN 750 MG PO TABS: 750.0000 mg | ORAL_TABLET | Freq: Every day | ORAL | Status: DC

## 2012-10-09 MED ORDER — MIRTAZAPINE 15 MG PO TABS
15.0000 mg | ORAL_TABLET | Freq: Every day | ORAL | Status: DC
  Filled 2012-10-09: qty 1

## 2012-10-09 MED ORDER — ONDANSETRON HCL 8 MG PO TABS
4.0000 mg | ORAL_TABLET | Freq: Four times a day (QID) | ORAL | Status: DC | PRN
  Filled 2012-10-09: qty 0.5

## 2012-10-09 MED ORDER — LEVOFLOXACIN IN D5W 750 MG/150ML IV SOLN
750.0000 mg | INTRAVENOUS | Status: DC
  Administered 2012-10-09: 750 mg via INTRAVENOUS
  Filled 2012-10-09: qty 150

## 2012-10-09 MED ORDER — SIMVASTATIN 20 MG PO TABS
20.0000 mg | ORAL_TABLET | Freq: Every day | ORAL | Status: DC
  Filled 2012-10-09: qty 1

## 2012-10-09 MED ORDER — AMLODIPINE BESYLATE 10 MG PO TABS
10.0000 mg | ORAL_TABLET | Freq: Every day | ORAL | Status: DC
  Administered 2012-10-09: 10 mg via ORAL
  Filled 2012-10-09: qty 1

## 2012-10-09 MED ORDER — ASPIRIN EC 325 MG PO TBEC
325.0000 mg | DELAYED_RELEASE_TABLET | Freq: Every day | ORAL | Status: DC
Start: 2012-10-09 — End: 2012-10-09

## 2012-10-09 MED ORDER — PANTOPRAZOLE SODIUM 40 MG PO TBEC
40.0000 mg | DELAYED_RELEASE_TABLET | Freq: Every day | ORAL | Status: DC
  Administered 2012-10-09: 40 mg via ORAL
  Filled 2012-10-09: qty 1

## 2012-10-09 MED ORDER — SODIUM CHLORIDE 0.9 % IJ SOLN
3.0000 mL | Freq: Two times a day (BID) | INTRAMUSCULAR | Status: DC
  Administered 2012-10-09: 3 mL via INTRAVENOUS

## 2012-10-09 MED ORDER — ACETAMINOPHEN 325 MG PO TABS: 650.0000 mg | ORAL_TABLET | Freq: Four times a day (QID) | ORAL | Status: DC | PRN

## 2012-10-09 MED ORDER — TIZANIDINE HCL 2 MG PO TABS
2.0000 mg | ORAL_TABLET | Freq: Four times a day (QID) | ORAL | Status: DC
  Administered 2012-10-09 (×2): 2 mg via ORAL
  Filled 2012-10-09 (×4): qty 1

## 2012-10-09 MED ORDER — DIVALPROEX SODIUM 250 MG PO DR TAB
250.0000 mg | DELAYED_RELEASE_TABLET | Freq: Three times a day (TID) | ORAL | Status: DC
  Administered 2012-10-09: 250 mg via ORAL
  Filled 2012-10-09 (×3): qty 1

## 2012-10-09 MED ORDER — DONEPEZIL HCL 5 MG PO TABS
5.0000 mg | ORAL_TABLET | Freq: Every day | ORAL | Status: DC
  Filled 2012-10-09: qty 1

## 2012-10-09 MED ORDER — PAROXETINE HCL 20 MG PO TABS
20.0000 mg | ORAL_TABLET | Freq: Every day | ORAL | Status: DC
  Administered 2012-10-09: 20 mg via ORAL
  Filled 2012-10-09: qty 1

## 2012-10-09 NOTE — ED Notes (Signed)
Report given to floor, jessica,rn.  Awaiting to take pt up after continuous neb is finished.

## 2012-10-09 NOTE — ED Notes (Signed)
Pt done with continuous neb tx.  Pt transported to floor via stretcher with monitor.

## 2012-10-09 NOTE — Progress Notes (Signed)
[facility] Documentation currently down.  Full assessment to follow.  Clinical Social Worker received referral from MD indicating pt is from [facility] and is ready for dc today.  CSW confirmed dc plan with pt, son Loreen Freud.  CSW to submit dc summary to facility for their review.  Family requesting ambulance transport.   Angelia Mould, MSW, [place] [phone]

## 2012-10-09 NOTE — ED Notes (Signed)
Respiratory contacted for continuous neb.  States will be here shortly to start.

## 2012-10-09 NOTE — Progress Notes (Signed)
72yo male NH resident c/o fall with small laceration, in ED was noted to be wheezing and febrile, CXR showed no infiltrates, to begin Levaquin for bronchitis/COPD.  Will begin Levaquin 750mg  IV Q48 for CrCl ~35 ml/min (of note, SCr 1.9 is above those obtained recently, 1.3-1.5).  Vernard Gambles, PharmD, BCPS 10/09/2012 2:35 AM

## 2012-10-09 NOTE — Clinical Social Work Psychosocial (Signed)
     Clinical Social Work Department BRIEF PSYCHOSOCIAL ASSESSMENT 10/09/2012  Patient:  Erik [name], Erik [name]     Account Number:  [id number]     Admit date:  10/08/2012  Clinical Social Worker:  Margaree Mackintosh  Date/Time:  10/09/2012 12:00 M  Referred by:  Physician  Date Referred:  10/09/2012 Referred for  SNF Placement   Other Referral:   Interview type:  Patient Other interview type:   with son, Erik [name], present.    PSYCHOSOCIAL DATA Living Status:  FACILITY Admitted from facility:  [facility] Level of care:  Skilled Nursing Facility Primary support name:  Erik [name]: [phone] Primary support relationship to patient:  CHILD, ADULT Degree of support available:   Unknown.    CURRENT CONCERNS Current Concerns  Post-Acute Placement   Other Concerns:    SOCIAL WORK ASSESSMENT / PLAN Clinical Social WOrker recieved referral indicating pt is from [facility] and ready to return today.  CSW reviewed chart and staffed case with MD.  CSW met with pt and his son at bedside.  CSW introduced self, explained role, and provided support.  Pt and son confirmed plan for pt to return to [facility].  CSW contacted Marylene Land at [facility] able to accept pt today and will only require dc summary as pt has been in hospital < 24 hrs. CSW submitted dc summary.  CSW prepared dc packet.   Assessment/plan status:  Information/Referral to [facility] Other assessment/ plan:   Information/referral to community resources:   SNF    PATIENTS/FAMILYS RESPONSE TO PLAN OF CARE: Pt and son were pleasant and engaged in conversation.  Pt and son thanked CSW for intervention.

## 2012-10-09 NOTE — ED Notes (Signed)
Report called to floor.  Nurse will call back.

## 2012-10-09 NOTE — ED Notes (Signed)
Respiratory at bedside to start cont neb.

## 2012-10-09 NOTE — Progress Notes (Signed)
Inpatient Diabetes Program Recommendations  AACE/ADA: New Consensus Statement on Inpatient Glycemic Control (2013)  Target Ranges:  Prepandial:   less than 140 mg/dL      Peak postprandial:   less than 180 mg/dL (1-2 hours)      Critically ill patients:  140 - 180 mg/dL   Results for HUSTON, STONEHOCKER (MRN [id number]) as of 10/09/2012 11:24  Ref. Range 10/09/2012 02:53 10/09/2012 07:53  Glucose-Capillary Latest Range: 70-99 mg/dL 409 (H) 811 (H)  Results for KACHE, MCCLURG (MRN [id number]) as of 10/09/2012 11:24  Ref. Range 10/08/2012 19:40 10/09/2012 05:20  Glucose Latest Range: 70-99 mg/dL 213 (H) 086 (H)    Inpatient Diabetes Program Recommendations Correction (SSI): Please consider checking blood glucose ACH with Novolog correction scale if necessary. HgbA1C: Please consider ordering an A1C to determine glycemic control over the last 2-3 months.  Last A1C in the chart was 6.0% on 10/05/2008. Diet: May want to consider placing patient on Carb Modified ADA diet due to elevated blood glucose.  Note: Patient does not have a documented history of diabetes.  Initial lab glucose on 1/16 was 116 mg/dl at 57:84.  Patient did receive one dose of Prednisone 60mg  on 1/16 at 21:31.  Fasting finger stick blood glucose this morning was 248 mg/dl.  Please consider ordering an A1C, checking CBGs ACHS with Novolog correction if necessary, and may want to consider changing diet to Carb Modified ADA diet.  Will continue to follow.  Thanks, Orlando Penner, RN, BSN, CCRN Diabetes Coordinator Inpatient Diabetes Program [phone]

## 2012-10-09 NOTE — ED Notes (Signed)
Spoke with daughter Ashok Croon,  Telephone number [phone].  Informed that pt is being admitted for COPD exac.

## 2012-10-09 NOTE — ED Notes (Signed)
Pt informed about urine sample. States unable to pee at this time.

## 2012-10-09 NOTE — ED Notes (Signed)
Pt found to have wet bed.  Pt sheets and gown changed and pt was repositioned.

## 2012-10-09 NOTE — Discharge Summary (Signed)
Physician Discharge Summary  Patient ID: WOODARD PERRELL MRN: [id number] DOB/AGE: [birth year] 74 y.o.  Admit date: 10/08/2012 Discharge date: 10/09/2012  Primary Care Physician:  Rene Paci, MD   Discharge Diagnoses:    Principal Problem:  *Bronchitis Active Problems:  ARF (acute renal failure)  Fall  Fever  CKD (chronic kidney disease) stage 3, GFR 30-59 ml/min  Laceration of periorbital area  Facial bruising      Medication List     As of 10/09/2012 12:32 PM    TAKE these medications         amLODipine 10 MG tablet   Commonly known as: NORVASC   Take 1 tablet (10 mg total) by mouth daily.      aspirin 325 MG EC tablet   Take 325 mg by mouth at bedtime.      COMBIVENT RESPIMAT 20-100 MCG/ACT Aers respimat   Generic drug: Ipratropium-Albuterol   Inhale 1 puff into the lungs every 6 (six) hours as needed. For shortness of breath or wheezing      divalproex 250 MG DR tablet   Commonly known as: DEPAKOTE   Take 1 tablet (250 mg total) by mouth 3 (three) times daily.      docusate sodium 100 MG capsule   Commonly known as: COLACE   Take 200 mg by mouth at bedtime.      donepezil 5 MG tablet   Commonly known as: ARICEPT   Take 5 mg by mouth at bedtime.      esomeprazole 40 MG capsule   Commonly known as: NEXIUM   Take 1 capsule (40 mg total) by mouth daily before breakfast.      furosemide 40 MG tablet   Commonly known as: LASIX   Take 1 tablet (40 mg total) by mouth daily.      hydrALAZINE 50 MG tablet   Commonly known as: APRESOLINE   Take 1 tablet (50 mg total) by mouth 4 (four) times daily.      levofloxacin 750 MG tablet   Commonly known as: LEVAQUIN   Take 1 tablet (750 mg total) by mouth daily.      lovastatin 40 MG tablet   Commonly known as: MEVACOR   Take 1 tablet (40 mg total) by mouth at bedtime.      mirtazapine 15 MG tablet   Commonly known as: REMERON   Take 1 tablet (15 mg total) by mouth at bedtime.      PARoxetine 20 MG  tablet   Commonly known as: PAXIL   Take 1 tablet (20 mg total) by mouth daily.      Tamsulosin HCl 0.4 MG Caps   Commonly known as: FLOMAX   Take 1 capsule (0.4 mg total) by mouth daily.      tiZANidine 2 MG tablet   Commonly known as: ZANAFLEX   Take 2 mg by mouth 4 (four) times daily.      traMADol-acetaminophen 37.5-325 MG per tablet   Commonly known as: ULTRACET   Take 1 tablet by mouth every 6 (six) hours as needed. For pain         Disposition and Follow-up:  Will be discharged back to SNF today in stable condition. Will be on levaquin for 5 days.  Consults:  None   Significant Diagnostic Studies:  Dg Chest 2 View  10/08/2012  *RADIOLOGY REPORT*  Clinical Data: Cough, wheezing, shortness of breath.  CHEST - 2 VIEW  Comparison: 04/22/2012  Findings: Prior CABG.  Scarring noted  in the right lung base with elevation of the right hemidiaphragm, stable. Stable postsurgical changes on the right.  No acute airspace opacities or effusions. No acute bony abnormality.  IMPRESSION: Scarring/chronic changes in the right lung.  Postoperative changes. No acute findings.   Original Report Authenticated By: Charlett Nose, M.D.    Ct Head Wo Contrast  10/08/2012  *RADIOLOGY REPORT*  Clinical Data: Larey Seat, laceration, hematoma.  CT HEAD WITHOUT CONTRAST  Technique:  Contiguous axial images were obtained from the base of the skull through the vertex without contrast.  Comparison: MR 10/06/2008  Findings: Atherosclerotic and physiologic intracranial calcifications.  There is patchy mucoperiosteal thickening in ethmoid air cells and sphenoid sinus.  There is a right supraorbital scalp hematoma. Diffuse parenchymal atrophy. Patchy areas of hypoattenuation in deep and periventricular white matter bilaterally. Negative for acute intracranial hemorrhage, mass lesion, acute infarction, midline shift, or mass-effect. Acute infarct may be inapparent on noncontrast CT. Ventricles and sulci symmetric. Bone  windows demonstrate no focal lesion.  IMPRESSION:  1. Negative for bleed or other acute intracranial process.  2. Atrophy and nonspecific white matter changes   Original Report Authenticated By: D. Andria Rhein, MD     Brief H and P: For complete details please refer to admission H and P, but in brief patient is a 72 year-old man with dementia was brought from the nursing home after patient sustained a mechanical fall. Patient was going back to his room from the bathroom when he slipped and fell and hit his head on the bed. He sustained a small laceration on his right periorbital area was brought to the ER. In the ER CT was showing nothing acute. Patient had the laceration sutured. But in the ER patient was found to be wheezing and was found to be febrile. As per patient's daughter patient was having nasal congestion for last 2-3 days. Patient did not have any nausea vomiting or diarrhea or did not complain of any chest pain or abdominal pain. Patient at this time has been admitted for COPD exacerbation. Chest x-ray does not show any infiltrates. We were asked to admit him for further evaluation and management.     Hospital Course:  Principal Problem:  *Bronchitis Active Problems:  ARF (acute renal failure)  Fall  Fever  CKD (chronic kidney disease) stage 3, GFR 30-59 ml/min  Laceration of periorbital area  Facial bruising   Bronchitis -To continue levaquin for 5 days and combivent PRN. -Only received 1 dose of steroids in the ED. - Is not wheezing currently, so will withhold further steroids.  Fever -Likely 2/2 bronchitis. -No sign of PNA/UTI. -Flu PCR was negative. -Has been afebrile since admission.  Mechanical Fall  -Patient confirms no loss of consciousness. -Has suffered a right periorbital laceration that has been sutured and some right periorbital bruising.   Acute on CKD Stage III -Cr is at baseline. -I do recommend OP renal follow up as son states he has never seen a  nephrologist before.   Time spent on Discharge: Greater than 30 minutes.  SignedChaya Jan [facility] Pager: [phone] 10/09/2012, 12:32 PM

## 2012-11-02 ENCOUNTER — Other Ambulatory Visit (INDEPENDENT_AMBULATORY_CARE_PROVIDER_SITE_OTHER): Payer: Medicare Other

## 2012-11-02 ENCOUNTER — Ambulatory Visit (INDEPENDENT_AMBULATORY_CARE_PROVIDER_SITE_OTHER): Payer: Medicare Other | Admitting: Internal Medicine

## 2012-11-02 ENCOUNTER — Encounter: Payer: Self-pay | Admitting: Internal Medicine

## 2012-11-02 VITALS — BP 120/62 | HR 75 | Temp 97.1°F | Ht 68.0 in | Wt 175.4 lb

## 2012-11-02 DIAGNOSIS — I10 Essential (primary) hypertension: Secondary | ICD-10-CM

## 2012-11-02 DIAGNOSIS — K219 Gastro-esophageal reflux disease without esophagitis: Secondary | ICD-10-CM

## 2012-11-02 DIAGNOSIS — F22 Delusional disorders: Secondary | ICD-10-CM

## 2012-11-02 DIAGNOSIS — N179 Acute kidney failure, unspecified: Secondary | ICD-10-CM

## 2012-11-02 DIAGNOSIS — F0151 Vascular dementia with behavioral disturbance: Secondary | ICD-10-CM

## 2012-11-02 LAB — URINALYSIS, ROUTINE W REFLEX MICROSCOPIC
Bilirubin Urine: NEGATIVE
Hgb urine dipstick: NEGATIVE
Ketones, ur: NEGATIVE
Leukocytes, UA: NEGATIVE
Nitrite: NEGATIVE
Total Protein, Urine: NEGATIVE

## 2012-11-02 LAB — BASIC METABOLIC PANEL
BUN: 21 mg/dL (ref 6–23)
Creatinine, Ser: 1.5 mg/dL (ref 0.4–1.5)
GFR: 50.19 mL/min — ABNORMAL LOW (ref >60.00)
Glucose, Bld: 94 mg/dL (ref 70–99)

## 2012-11-02 NOTE — Patient Instructions (Addendum)
It was good to see you today. We have reviewed your prior records including labs and tests today Test(s) ordered today. Your results will be released to MyChart (or called to you) after review, usually within 72hours after test completion. If any changes need to be made, you will be notified at that same time. Medications reviewed and updated, no recommended changes today  Please schedule followup in 6 months, call sooner if problems.

## 2012-11-02 NOTE — Assessment & Plan Note (Signed)
The current medical regimen is effective;  continue present plan and medications.  BP Readings from Last 3 Encounters:  11/02/12 120/62  10/09/12 135/66  09/01/12 142/62
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
## 2012-11-02 NOTE — Assessment & Plan Note (Signed)
Acute exac 09/2012 - recheck now Baseline Cr 1.4

## 2012-11-02 NOTE — Assessment & Plan Note (Signed)
Changed from prior Nexium to omeprazole by SNF 09/2012 - ?ineffective symptoms relief family will discuss with SNF re: other options

## 2013-03-01 DIAGNOSIS — K219 Gastro-esophageal reflux disease without esophagitis: Secondary | ICD-10-CM

## 2013-03-01 DIAGNOSIS — N183 Chronic kidney disease, stage 3 (moderate): Secondary | ICD-10-CM

## 2013-03-01 DIAGNOSIS — I2510 Atherosclerotic heart disease of native coronary artery without angina pectoris: Secondary | ICD-10-CM

## 2013-04-06 ENCOUNTER — Non-Acute Institutional Stay (SKILLED_NURSING_FACILITY): Payer: PRIVATE HEALTH INSURANCE | Admitting: Internal Medicine

## 2013-04-06 DIAGNOSIS — I69959 Hemiplegia and hemiparesis following unspecified cerebrovascular disease affecting unspecified side: Secondary | ICD-10-CM

## 2013-04-06 DIAGNOSIS — K219 Gastro-esophageal reflux disease without esophagitis: Secondary | ICD-10-CM

## 2013-04-06 DIAGNOSIS — I2510 Atherosclerotic heart disease of native coronary artery without angina pectoris: Secondary | ICD-10-CM

## 2013-04-06 NOTE — Progress Notes (Signed)
Patient ID: Erik [name], male   DOB: [birth year], 72 y.o.   MRN: [id number] Facility; [facility]. Chief complaint; [facility] routine visit for June; followup multiple medical issues. History; this is a man who was admitted to the facility directly from home. 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 As far as I can tell. He has remained fairly stable in the facility. He has osteoarthritis of the right knee. He was receiving Voltaren gel, which the patient says it has been helpful.  Past medical history/problem list. #1 vascular dementia with depressed mood. #2 osteoarthritis of the right knee. #3 BPH. #4 renal insufficiency last creatinine at 1.25 on June 18, estimated GFR at 94 #5 coronary artery disease, status post CABG in 1999. Risk factors are all well controlled including hypertension, hyperlipidemia. He is on aspirin. He does not complaining of chest pain. 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 #7 esophageal reflux. #8 COPD  Physical exam; respiratory clear entry bilaterally. Cardiac 2/6 pansystolic murmur. No radiation. No signs of CHF. Abdomen no masses. Musculoskeletal right knee osteoarthritis. Will probably need a joint injection if he continues to complain Neurologic chronic mild left arm weakness. He is still ambulatory with a walker.  Impression/plan #1 vascular dementia; this does not appear to be  unstable #2 osteoarthritis of the right knee. Consider right knee steroid injection #3 gastroesophageal reflux #4 cardiac risk factors appear to be generally well-controlled

## 2013-04-23 ENCOUNTER — Encounter: Payer: Self-pay | Admitting: Cardiology

## 2013-04-23 ENCOUNTER — Ambulatory Visit (INDEPENDENT_AMBULATORY_CARE_PROVIDER_SITE_OTHER): Payer: Medicare Other | Admitting: Cardiology

## 2013-04-23 VITALS — BP 118/70 | HR 70 | Ht 68.0 in | Wt 167.8 lb

## 2013-04-23 DIAGNOSIS — I10 Essential (primary) hypertension: Secondary | ICD-10-CM

## 2013-04-23 DIAGNOSIS — E785 Hyperlipidemia, unspecified: Secondary | ICD-10-CM

## 2013-04-23 DIAGNOSIS — I5032 Chronic diastolic (congestive) heart failure: Secondary | ICD-10-CM

## 2013-04-23 DIAGNOSIS — I2510 Atherosclerotic heart disease of native coronary artery without angina pectoris: Secondary | ICD-10-CM

## 2013-04-23 DIAGNOSIS — I679 Cerebrovascular disease, unspecified: Secondary | ICD-10-CM

## 2013-04-23 NOTE — Assessment & Plan Note (Signed)
Continue aspirin and statin. I discussed further treatment and evaluation with patient and his daughter. He has significant dementia and now resides in a nursing home. He is a no CODE BLUE. They do not want further cardiac testing in this is certainly appropriate. We will plan medical therapy only.

## 2013-04-23 NOTE — Assessment & Plan Note (Signed)
Continue statin. Lipids and liver monitored by primary care. 

## 2013-04-23 NOTE — Assessment & Plan Note (Signed)
Continue aspirin and statin. 

## 2013-04-23 NOTE — Patient Instructions (Addendum)
Your physician wants you to follow-up in: ONE YEAR WITH DR CRENSHAW You will receive a reminder letter in the mail two months in advance. If you don't receive a letter, please call our office to schedule the follow-up appointment.  
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
## 2013-04-23 NOTE — Assessment & Plan Note (Signed)
Continue present blood pressure medications. 

## 2013-04-23 NOTE — Assessment & Plan Note (Signed)
Continue present dose of Lasix. Potassium and renal function monitored by primary care. 

## 2013-05-22 ENCOUNTER — Non-Acute Institutional Stay (SKILLED_NURSING_FACILITY): Payer: PRIVATE HEALTH INSURANCE | Admitting: Internal Medicine

## 2013-05-22 DIAGNOSIS — I69959 Hemiplegia and hemiparesis following unspecified cerebrovascular disease affecting unspecified side: Secondary | ICD-10-CM

## 2013-05-22 DIAGNOSIS — I2510 Atherosclerotic heart disease of native coronary artery without angina pectoris: Secondary | ICD-10-CM

## 2013-05-22 NOTE — Progress Notes (Signed)
Patient ID: Erik [name], male   DOB: [birth year], 72 y.o.   MRN: [id number] Facility; [facility]. Chief complaint; [facility] routine visit for July; followup multiple medical issues. History; this is a man who was admitted to the facility directly from home. 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 As far as I can tell. He has remained fairly stable in the facility. He has osteoarthritis of the right knee. He was receiving Voltaren gel, which the patient says it has been helpful. He is recently followed up with cardiology. No additional medication changes were made for his coronary artery disease.  Past medical history/problem list. #1 vascular dementia with depressed mood. #2 osteoarthritis of the right knee. #3 BPH. #4 renal insufficiency last creatinine at 1.25 on June 18, estimated GFR at 7 #5 coronary artery disease, status post CABG in 1999. Risk factors are all well controlled including hypertension, hyperlipidemia. He is on aspirin. He does not complaining of chest pain. 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 #7 esophageal reflux. #8 COPD  Physical exam; respiratory clear entry bilaterally. Cardiac 2/6 pansystolic murmur. No radiation. No signs of CHF. Abdomen no masses. Musculoskeletal right knee osteoarthritis. Will probably need a joint injection if he continues to complain Neurologic chronic mild left arm weakness. He is still ambulatory with a walker.  Impression/plan #1 vascular dementia; this does not appear to be  unstable #2 osteoarthritis of the right knee. Consider right knee steroid injection #3 gastroesophageal reflux #4 cardiac risk factors appear to be generally well-controlled. Recently reviewed by cardiology.

## 2013-06-08 ENCOUNTER — Non-Acute Institutional Stay (SKILLED_NURSING_FACILITY): Payer: PRIVATE HEALTH INSURANCE | Admitting: Internal Medicine

## 2013-06-08 DIAGNOSIS — I69959 Hemiplegia and hemiparesis following unspecified cerebrovascular disease affecting unspecified side: Secondary | ICD-10-CM

## 2013-06-08 DIAGNOSIS — I2510 Atherosclerotic heart disease of native coronary artery without angina pectoris: Secondary | ICD-10-CM

## 2013-06-08 NOTE — Progress Notes (Signed)
Patient ID: Erik [name], male   DOB: 02-Sep-1941, 72 y.o.   MRN: [id number] Facility; [facility]. Chief complaint; [facility] routine visit for August; followup multiple medical issues. History; this is a man who was admitted to the facility directly from home. 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 As far as I can tell. He has remained fairly stable in the facility. He has osteoarthritis of the right knee. He was receiving Voltaren gel, which the patient says it has been helpful. He is recently followed up with cardiology. No additional medication changes were made for his coronary artery disease.  Past medical history/problem list. #1 vascular dementia with depressed mood. #2 osteoarthritis of the right knee. #3 BPH. #4 renal insufficiency last creatinine at 1.25 on June 18, estimated GFR at 36 #5 coronary artery disease, status post CABG in 1999. Risk factors are all well controlled including hypertension, hyperlipidemia. He is on aspirin. He does not complaining of chest pain. 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 #7 esophageal reflux. #8 COPD  Physical exam; respiratory clear entry bilaterally. Cardiac 2/6 pansystolic murmur. No radiation. No signs of CHF. Abdomen no masses. Musculoskeletal right knee osteoarthritis. Will probably need a joint injection if he continues to complain Neurologic chronic mild left arm weakness. He is still ambulatory with a walker.  Impression/plan #1 vascular dementia; this does not appear to be  unstable #2 osteoarthritis of the right knee. Consider right knee steroid injection #3 gastroesophageal reflux #4 cardiac risk factors appear to be generally well-controlled. Recently reviewed by cardiology.

## 2013-07-02 ENCOUNTER — Encounter (INDEPENDENT_AMBULATORY_CARE_PROVIDER_SITE_OTHER): Payer: Medicare Other

## 2013-07-02 DIAGNOSIS — R0609 Other forms of dyspnea: Secondary | ICD-10-CM

## 2013-07-20 ENCOUNTER — Non-Acute Institutional Stay (SKILLED_NURSING_FACILITY): Payer: PRIVATE HEALTH INSURANCE | Admitting: Internal Medicine

## 2013-07-20 DIAGNOSIS — I2510 Atherosclerotic heart disease of native coronary artery without angina pectoris: Secondary | ICD-10-CM

## 2013-07-20 DIAGNOSIS — I69959 Hemiplegia and hemiparesis following unspecified cerebrovascular disease affecting unspecified side: Secondary | ICD-10-CM

## 2013-07-20 NOTE — Progress Notes (Signed)
Patient ID: Erik [name], male   DOB: [birth year], 72 y.o.   MRN: [id number] Facility; [facility]. Chief complaint; [facility] routine visit for September; followup multiple medical issues. History; this is a man who was admitted to the facility directly from home. 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 As far as I can tell. He has remained fairly stable in the facility. He has osteoarthritis of the right knee. He was receiving Voltaren gel, which the patient says it has been helpful. He is recently followed up with cardiology. No additional medication changes were made for his coronary artery disease. He had a traumatic injury to his right knee however an x-ray was negative and he responded to conservative measures with analgesics and ice packs.  Past medical history/problem list. #1 vascular dementia with depressed mood. #2 osteoarthritis of the right knee. #3 BPH. #4 renal insufficiency last creatinine at 1.25 on June 18, estimated GFR at 97 #5 coronary artery disease, status post CABG in 1999. Risk factors are all well controlled including hypertension, hyperlipidemia. He is on aspirin. He does not complaining of chest pain. 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 #7 esophageal reflux. #8 COPD  Physical exam; respiratory clear entry bilaterally. Cardiac 2/6 pansystolic murmur. No radiation. No signs of CHF. Abdomen no masses. Musculoskeletal right knee osteoarthritis. Will probably need a joint injection if he continues to complain Neurologic chronic mild left arm weakness. He is still ambulatory with a walker.  Impression/plan #1 vascular dementia; this does not appear to be  unstable #2 osteoarthritis of the right knee. Consider right knee steroid injection #3 gastroesophageal reflux #4 cardiac risk factors appear to be generally well-controlled. Recently reviewed by cardiology.

## 2013-08-17 ENCOUNTER — Non-Acute Institutional Stay (SKILLED_NURSING_FACILITY): Payer: PRIVATE HEALTH INSURANCE | Admitting: Internal Medicine

## 2013-08-17 DIAGNOSIS — I2510 Atherosclerotic heart disease of native coronary artery without angina pectoris: Secondary | ICD-10-CM

## 2013-08-17 NOTE — Progress Notes (Signed)
Patient ID: Erik [name], male   DOB: [birth year], 72 y.o.   MRN: [id number] Facility; [facility]. Chief complaint; [facility] routine visit for October; followup multiple medical issues. History; this is a man who was admitted to the facility directly from home. 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 As far as I can tell. He has remained fairly stable in the facility. He has osteoarthritis of the right knee. He was receiving Voltaren gel, which the patient says it has been helpful. He is recently followed up with cardiology. No additional medication changes were made for his coronary artery disease. He had a traumatic injury to his right knee however an x-ray was negative and he responded to conservative measures with analgesics and ice packs.  Past medical history/problem list. #1 vascular dementia with depressed mood. #2 osteoarthritis of the right knee. #3 BPH. #4 renal insufficiency last creatinine at 1.25 on June 18, estimated GFR at 75 #5 coronary artery disease, status post CABG in 1999. Risk factors are all well controlled including hypertension, hyperlipidemia. He is on aspirin. He does not complaining of chest pain. 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 #7 esophageal reflux. #8 COPD  Physical exam; respiratory clear entry bilaterally. Cardiac 2/6 pansystolic murmur. No radiation. No signs of CHF. Abdomen no masses. Musculoskeletal right knee osteoarthritis. Will probably need a joint injection if he continues to complain Neurologic chronic mild left arm weakness. He is still ambulatory with a walker.  Impression/plan #1 vascular dementia; this does not appear to be  unstable #2 osteoarthritis of the right knee. Consider right knee steroid injection #3 gastroesophageal reflux on Nexium #4 cardiac risk factors appear to be generally well-controlled. Recently reviewed by cardiology. CABG in 199. On asa, #5hyperlipidemia; on lipitor. LDL 64  03/2013

## 2013-09-14 ENCOUNTER — Non-Acute Institutional Stay (SKILLED_NURSING_FACILITY): Payer: PRIVATE HEALTH INSURANCE | Admitting: Internal Medicine

## 2013-09-14 DIAGNOSIS — J4489 Other specified chronic obstructive pulmonary disease: Secondary | ICD-10-CM

## 2013-09-14 DIAGNOSIS — I2510 Atherosclerotic heart disease of native coronary artery without angina pectoris: Secondary | ICD-10-CM

## 2013-09-14 DIAGNOSIS — J449 Chronic obstructive pulmonary disease, unspecified: Secondary | ICD-10-CM

## 2013-09-14 DIAGNOSIS — N183 Chronic kidney disease, stage 3 unspecified: Secondary | ICD-10-CM

## 2013-09-14 NOTE — Progress Notes (Signed)
Patient ID: Erik [name], male   DOB: 07/26/1941, 72 y.o.   MRN: [id number] Facility; [facility]. Chief complaint; [employer] visit for November; followup multiple medical issues. History; this is a man who was admitted to the facility directly from home. 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 As far as I can tell. He has remained fairly stable in the facility. He has osteoarthritis of the right knee. He was receiving Voltaren gel, which the patient says it has been helpful. He is recently followed up with cardiology. No additional medication changes were made for his coronary artery disease. He had a traumatic injury to his right knee however an x-ray was negative and he responded to conservative measures with analgesics and ice   Patient is ambulating in the facility with restorative for per his weight is increased and a supplement has been discontinued that. He does ask for ""breathing medication for shortness of breath. He has known COPD  Past medical history/problem list. #1 vascular dementia with depressed mood. #2 osteoarthritis of the right knee. #3 BPH. #4 renal insufficiency last creatinine at 1.25 on June 18, estimated GFR at 4 #5 coronary artery disease, status post CABG in 1999. Risk factors are all well controlled including hypertension, hyperlipidemia. He is on aspirin. He does not complaining of chest pain. 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 #7 esophageal reflux. #8 COPD  Physical exam; respiratory clear entry bilaterally. Pulse is 78 respirations 20 blood pressure 138/58 weight is 181 pounds O2 sat 95% on room air Cardiac 2/6 pansystolic murmur. No radiation. No signs of CHF. Abdomen no masses. Musculoskeletal right knee osteoarthritis. Will probably need a joint injection if he continues to complain Neurologic chronic mild left arm weakness. He is still ambulatory with a walker.  Impression/plan #1 vascular dementia; this does not  appear to be  unstable #2 osteoarthritis of the right knee. Consider right knee steroid injection #3 gastroesophageal reflux on Nexium #4 cardiac risk factors appear to be generally well-controlled. Recently reviewed by cardiology. CABG in 199. On asa, #5hyperlipidemia; on lipitor. LDL 64 03/2013

## 2013-10-17 ENCOUNTER — Non-Acute Institutional Stay (SKILLED_NURSING_FACILITY): Payer: PRIVATE HEALTH INSURANCE | Admitting: Internal Medicine

## 2013-10-17 DIAGNOSIS — N183 Chronic kidney disease, stage 3 unspecified: Secondary | ICD-10-CM

## 2013-10-17 DIAGNOSIS — I2510 Atherosclerotic heart disease of native coronary artery without angina pectoris: Secondary | ICD-10-CM

## 2013-10-17 NOTE — Progress Notes (Signed)
Patient ID: Erik [name], male   DOB: 03-11-1941, 73 y.o.   MRN: [id number] Facility; [facility]. Chief complaint; [employer] visit for December; followup multiple medical issues. History; this is a man who was admitted to the facility directly from home. 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 As far as I can tell. He has remained fairly stable in the facility. He has osteoarthritis of the right knee. He was receiving Voltaren gel, which the patient says it has been helpful. He is recently followed up with cardiology. No additional medication changes were made for his coronary artery disease. He had a traumatic injury to his right knee however an x-ray was negative and he responded to conservative measures with analgesics and ice   Patient is ambulating in the facility with restorative for per his weight is increased and a supplement has been discontinued that. He does ask for ""breathing medication for shortness of breath. He has known COPD  His weight is down 4 pound, states he does not like the food.  Social; the patient is a DO NOT RESUSCITATE. He does not want to be intubated but would accept BiPAP, IV fluids  Past medical history/problem list. #1 vascular dementia with depressed mood. #2 osteoarthritis of the right knee. #3 BPH. #4 renal insufficiency last creatinine at 1.25 on June 18, estimated GFR at 53 #5 coronary artery disease, status post CABG in 1999. Risk factors are all well controlled including hypertension, hyperlipidemia. He is on aspirin. He does not complaining of chest pain. 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 #7 esophageal reflux. #8 COPD  Physical exam; respiratory clear entry bilaterally. Temperature 98.3, pulse 61, respirations 18, BP 133/74, O2 sat 95% on room air, weight is 177. Cardiac 2/6 pansystolic murmur. No radiation. No signs of CHF. Abdomen no masses. Musculoskeletal right knee osteoarthritis. Will probably need a joint  injection if he continues to complain Neurologic chronic mild left arm weakness. He is still ambulatory with a walker.  Impression/plan #1 vascular dementia; this does not appear to be  unstable #2 osteoarthritis of the right knee. No current complaints of right knee #3 gastroesophageal reflux on Nexium #4 cardiac risk factors appear to be generally well-controlled. Recently reviewed by cardiology. CABG in 1999. On asa,. He does not describe exertional chest symptoms #5hyperlipidemia; on lipitor. LDL 64 03/2013

## 2013-12-20 ENCOUNTER — Non-Acute Institutional Stay (SKILLED_NURSING_FACILITY): Payer: PRIVATE HEALTH INSURANCE | Admitting: Internal Medicine

## 2013-12-20 DIAGNOSIS — I2510 Atherosclerotic heart disease of native coronary artery without angina pectoris: Secondary | ICD-10-CM

## 2013-12-20 DIAGNOSIS — F0152 Vascular dementia, unspecified severity, with psychotic disturbance: Secondary | ICD-10-CM

## 2013-12-20 DIAGNOSIS — F0151 Vascular dementia with behavioral disturbance: Secondary | ICD-10-CM

## 2013-12-20 DIAGNOSIS — F22 Delusional disorders: Principal | ICD-10-CM
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
## 2014-01-10 ENCOUNTER — Encounter: Payer: Self-pay | Admitting: Internal Medicine

## 2014-01-10 ENCOUNTER — Ambulatory Visit (INDEPENDENT_AMBULATORY_CARE_PROVIDER_SITE_OTHER): Payer: Medicare Other | Admitting: Internal Medicine

## 2014-01-10 ENCOUNTER — Ambulatory Visit (INDEPENDENT_AMBULATORY_CARE_PROVIDER_SITE_OTHER)
Admission: RE | Admit: 2014-01-10 | Discharge: 2014-01-10 | Disposition: A | Discharge status: Home / Self Care | Payer: Medicare Other | Source: Ambulatory Visit | Attending: Internal Medicine | Admitting: Internal Medicine

## 2014-01-10 VITALS — BP 114/60 | HR 60 | Temp 97.7°F | Ht 68.0 in | Wt 182.0 lb

## 2014-01-10 DIAGNOSIS — K219 Gastro-esophageal reflux disease without esophagitis: Secondary | ICD-10-CM

## 2014-01-10 DIAGNOSIS — R0902 Hypoxemia: Secondary | ICD-10-CM

## 2014-01-10 DIAGNOSIS — R0602 Shortness of breath: Secondary | ICD-10-CM

## 2014-01-10 DIAGNOSIS — I10 Essential (primary) hypertension: Secondary | ICD-10-CM

## 2014-01-10 DIAGNOSIS — C3490 Malignant neoplasm of unspecified part of unspecified bronchus or lung: Secondary | ICD-10-CM

## 2014-01-10 MED ORDER — ESOMEPRAZOLE MAGNESIUM 40 MG PO CPDR: DELAYED_RELEASE_CAPSULE | ORAL | Status: DC

## 2014-01-10 NOTE — Patient Instructions (Addendum)
Change nexium to 40 mg Take 30- 60 min before your first and last meals of the day   GERD (REFLUX)  is an extremely common cause of respiratory symptoms, many times with no significant heartburn at all.    It can be treated with medication, but also with lifestyle changes including avoidance of late meals, excessive alcohol, smoking cessation, and avoid fatty foods, chocolate, peppermint, colas, red wine, and acidic juices such as orange juice.  NO MINT OR MENTHOL PRODUCTS SO NO COUGH DROPS  USE SUGARLESS CANDY INSTEAD (jolley ranchers or Stover's)  NO OIL BASED VITAMINS - use powdered substitutes.   Please see patient coordinator before you leave today  to schedule GI Eval for recurrent / chronic vomiting   Please remember to go to the   x-ray department downstairs for your tests - we will call you with the results when they are available.

## 2014-01-10 NOTE — Progress Notes (Signed)
Subjective:    Patient ID: Erik [name], male    DOB: 1940-12-15  MRN: [id number]  HPI  68 yowm quit smoking 1999 with parkinson's dz in SNF at [facility] under Dr Dellia Nims with new onset doe x fall 2014 referred 01/10/2014 to pulmonary clinic for evaluation.   01/10/2014 1st [facility] office visit/ Rosa Gamb[facility]  Chief Complaint  Patient presents with  . Pulmonary Consult    Referred per Dr. Linton Ham. Pt c/o SOB for the past 6 months.  He states that he gets out of breath walking any distance and is currently using wheelchair due to this.   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 No unusual exp hx or h/o childhood pna/ asthma or knowledge of premature birth.  Sleeping ok without nocturnal  or early am exacerbation  of respiratory  c/o's or need for noct saba. Also denies any obvious fluctuation of symptoms with weather or environmental changes or other aggravating or alleviating factors except as outlined above   Current Medications, Allergies, Complete Past Medical History, Past Surgical History, Family History, and Social History were reviewed in [employer] record.           Review of Systems  Constitutional: Negative for fever, chills, activity change, appetite change and unexpected weight change.  HENT: Negative for congestion, dental problem, postnasal drip, rhinorrhea, sneezing, sore throat, trouble swallowing and  voice change.   Eyes: Negative for visual disturbance.  Respiratory: Positive for shortness of breath. Negative for cough and choking.   Cardiovascular: Negative for chest pain and leg swelling.  Gastrointestinal: Negative for nausea, vomiting and abdominal pain.  Genitourinary: Negative for difficulty urinating.  Musculoskeletal: Negative for arthralgias.  Skin: Negative for rash.  Psychiatric/Behavioral: Negative for behavioral problems and confusion.       Objective:   Physical Exam  W/c bound wm nad at rest  Wt Readings from Last 3 Encounters:  01/10/14 182 lb (82.555 kg)  04/23/13 167 lb 12.8 oz (76.114 kg)  11/02/12 175 lb 6.4 oz (79.561 kg)      HEENT: nl dentition, turbinates, and orophanx. Nl external ear canals without cough reflex   NECK :  without JVD/Nodes/TM/ nl carotid upstrokes bilaterally   LUNGS: no acc muscle use, decreased bs esp R base but no crackles on insp or exp wheezes   CV:  RRR  no s3 or murmur or increase in P2, no edema   ABD:  soft and nontender with nl excursion in the supine position. No bruits or organomegaly, bowel sounds nl  MS:  warm without deformities, calf tenderness, cyanosis or clubbing  SKIN: warm and dry without lesions    NEURO:  alert, approp, pill rolling tremor noted bilaterally     CXR  01/10/2014 :  Blunting of the right costophrenic angle likely from scarring secondary to prior lobectomy. There is no focal consolidation  or pneumothorax. No left pleural effusion. The heart and mediastinal contours are unremarkable. Prior CABG.  Right chest wall deformity from prior surgery. There is mild thoracic spine spondylosis.       Assessment & Plan:

## 2014-01-11 DIAGNOSIS — R0902 Hypoxemia: Secondary | ICD-10-CM | POA: Insufficient documentation

## 2014-01-11 NOTE — Progress Notes (Signed)
Quick Note:  Spoke with pt and notified of results per Dr. Wert. Pt verbalized understanding and denied any questions.  ______ 

## 2014-01-11 NOTE — Assessment & Plan Note (Signed)
-   01/10/2014   Walked RA x 50 ft stopped due to  desat to 84% > rec amb 02 to maintain above 90% with activity

## 2014-01-11 NOTE — Assessment & Plan Note (Signed)
No evidence of recurrence on cxr  though can't r/o distant mets

## 2014-01-11 NOTE — Assessment & Plan Note (Addendum)
Note high doses of norvasc/amlodipine can potentially interfere with v/q matching and may need to consider alternative here like minoxidil and d/c hydralazine as well but defer the final call on this to Dr Dellia Nims.

## 2014-01-11 NOTE — Assessment & Plan Note (Signed)
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

## 2014-01-11 NOTE — Assessment & Plan Note (Addendum)
Reported in 04/30/2012 last ov here Asa Lente ov) now clearly worse x 6 months assoc with new vomiting p meals suggesting ? Aspiration but not obvious on plain cxr to date   Does not appear to be due to copd/ AB  Priority therefore is to address the vomiting first then regroup.

## 2014-01-17 ENCOUNTER — Telehealth: Payer: Self-pay | Admitting: Internal Medicine

## 2014-01-17 NOTE — Telephone Encounter (Signed)
Patient with vomiting and reflux.  He will come in and see Alonza Bogus, PA tomorrow at 11:00

## 2014-01-18 ENCOUNTER — Ambulatory Visit (INDEPENDENT_AMBULATORY_CARE_PROVIDER_SITE_OTHER): Payer: Medicare Other | Admitting: Gastroenterology

## 2014-01-18 ENCOUNTER — Encounter: Payer: Self-pay | Admitting: Gastroenterology

## 2014-01-18 VITALS — BP 102/60 | HR 64 | Ht 68.0 in | Wt 176.0 lb

## 2014-01-18 DIAGNOSIS — R112 Nausea with vomiting, unspecified: Secondary | ICD-10-CM

## 2014-01-18 DIAGNOSIS — R1319 Other dysphagia: Secondary | ICD-10-CM

## 2014-01-18 NOTE — Progress Notes (Signed)
01/18/2014 Erik [name] [id number] 27-Apr-1941   History of Present Illness:  This is a 73 year old male who is previously known to Dr. Sharlett Iles. He has Parkinson's disease with dementia as well as history of lung cancer with lobectomy in 2007, COPD, coronary artery disease with previous CABG and stent placement in the late 1990s, and history of CVA in late 1990s as well.  He currently resides at [facility]. He presents to our office today with complaints of trouble swallowing and vomiting shortly after eating. The patient is a poor historian.  The patient describes that shortly after eating his food spontaneously comes back up.  He actually denies sensation that food is getting stuck or that he is having any problems following. His daughter does not noted that he is having difficulty swallowing. This issue has been occurring for the past several months, but has become very distressing to him recently. He does have reflux and indigestion and was previously on Nexium 40 mg once daily. It was increased to twice a day just one week ago when he was seen by pulmonology, however. The patient does drink coffee and drinks sometimes 4 Dr. Samson Frederic per day. It sounds like he is lying down shortly after eating meals as well.  He denies any nausea proceeding his symptoms. There are no complaints of abdominal pain.  He saw Dr.Wert last week and he mentioned in his note that the patient may potentially need to be placed on oxygen therapy while ambulatory due to him dropping his sats into the 80s. The patient's daughter is with him at his appointment today and she is not aware of this and the patient has not yet been receiving O2 therapy.  Current Medications, Allergies, Past Medical History, Past Surgical History, Family History and Social History were reviewed in [employer] record.   Physical Exam: BP 102/60  Pulse 64  Ht 5\' 8"  (1.727 m)  Wt 176 lb (79.833  kg)  BMI 26.77 kg/m2 General:  Elderly white male in no acute distress Head: Normocephalic and atraumatic Eyes:  sclerae anicteric, conjunctiva pink  Ears: Normal auditory acuity Lungs: Clear throughout to auscultation, but decreased breath sounds Heart: Regular rate and rhythm Abdomen: Soft, non-distended.  Normal bowel sounds.  Non-tender. Musculoskeletal: Symmetrical with no gross deformities  Extremities: No edema  Neurological: Alert; grossly non-focal.  Pill rolling tremor noted.  Assessment and Recommendations: -Dysphagia:  Food comes back up spontaneously shortly after eating.  No associated nausea.  Differential includes esophageal dysmotility vs achalasia vs esophageal stricture vs malignancy, etc.  Will check a barium esophagram first especially since there is some question about whether the patient needs to be placed on new 02 therapy.  We will follow-up the results of that and will determine if EGD is warranted.  His Nexium was recently increased to BID and he will continue that.  We will send instructions on GERD dietary/lifestyle modifications to the living facility where he resides.  Very important that the patient remain upright for 2-3 hours after eating.

## 2014-01-18 NOTE — Progress Notes (Signed)
i agree with the plan above, esophagram first. AntiGERD lifestyle changes.

## 2014-01-18 NOTE — Patient Instructions (Signed)
You have been scheduled for a Barium Esophogram at [facility] (1st floor of the hospital) on 01-25-2014 at 11 am. Please arrive 15 minutes prior to your appointment for registration. Make certain not to have anything to eat or drink 6 hours prior to your test. If you need to reschedule for any reason, please contact radiology at [phone] to do so. __________________________________________________________________ A barium swallow is an examination that concentrates on views of the esophagus. This tends to be a double contrast exam (barium and two liquids which, when combined, create a gas to distend the wall of the oesophagus) or single contrast (non-ionic iodine based). The study is usually tailored to your symptoms so a good history is essential. Attention is paid during the study to the form, structure and configuration of the esophagus, looking for functional disorders (such as aspiration, dysphagia, achalasia, motility and reflux) EXAMINATION You may be asked to change into a gown, depending on the type of swallow being performed. A radiologist and radiographer will perform the procedure. The radiologist will advise you of the type of contrast selected for your procedure and direct you during the exam. You will be asked to stand, sit or lie in several different positions and to hold a small amount of fluid in your mouth before being asked to swallow while the imaging is performed .In some instances you may be asked to swallow barium coated marshmallows to assess the motility of a solid food bolus. The exam can be recorded as a digital or video fluoroscopy procedure. POST PROCEDURE It will take 1-2 days for the barium to pass through your system. To facilitate this, it is important, unless otherwise directed, to increase your fluids for the next 24-48hrs and to resume your normal diet.  This test typically takes about 30 minutes to  perform. __________________________________________________________________________________  Diet for Gastroesophageal Reflux Disease, Adult Reflux (acid reflux) is when acid from your stomach flows up into the esophagus. When acid comes in contact with the esophagus, the acid causes irritation and soreness (inflammation) in the esophagus. When reflux happens often or so severely that it causes damage to the esophagus, it is called gastroesophageal reflux disease (GERD). Nutrition therapy can help ease the discomfort of GERD. FOODS OR DRINKS TO AVOID OR LIMIT  Smoking or chewing tobacco. Nicotine is one of the most potent stimulants to acid production in the gastrointestinal tract.  Caffeinated and decaffeinated coffee and black tea.  Regular or low-calorie carbonated beverages or energy drinks (caffeine-free carbonated beverages are allowed).   Strong spices, such as black pepper, white pepper, red pepper, cayenne, curry powder, and chili powder.  Peppermint or spearmint.  Chocolate.  High-fat foods, including meats and fried foods. Extra added fats including oils, butter, salad dressings, and nuts. Limit these to less than 8 tsp per day.  Fruits and vegetables if they are not tolerated, such as citrus fruits or tomatoes.  Alcohol.  Any food that seems to aggravate your condition. If you have questions regarding your diet, call your caregiver or a registered dietitian. OTHER THINGS THAT MAY HELP GERD INCLUDE:   Eating your meals slowly, in a relaxed setting.  Eating 5 to 6 small meals per day instead of 3 large meals.  Eliminating food for a period of time if it causes distress.  Not lying down until 3 hours after eating a meal.  Keeping the head of your bed raised 6 to 9 inches (15 to 23 cm) by using a foam wedge or blocks under  the legs of the bed. Lying flat may make symptoms worse.  Being physically active. Weight loss may be helpful in reducing reflux in overweight or  obese adults.  Wear loose fitting clothing EXAMPLE MEAL PLAN This meal plan is approximately 2,000 calories based on [email or web address] meal planning guidelines. Breakfast   cup cooked oatmeal.  1 cup strawberries.  1 cup low-fat milk.  1 oz almonds. Snack  1 cup cucumber slices.  6 oz yogurt (made from low-fat or fat-free milk). Lunch  2 slice whole-wheat bread.  2 oz sliced [place].  2 tsp mayonnaise.  1 cup blueberries.  1 cup snap peas. Snack  6 whole-wheat crackers.  1 oz string cheese. Dinner   cup brown rice.  1 cup mixed veggies.  1 tsp olive oil.  3 oz grilled fish. Document Released: 09/09/2005 Document Revised: 12/02/2011 Document Reviewed: 07/26/2011 [facility] Patient Information 2014 [place], [place].

## 2014-01-25 ENCOUNTER — Ambulatory Visit (HOSPITAL_COMMUNITY)
Admission: RE | Admit: 2014-01-25 | Discharge: 2014-01-25 | Disposition: A | Discharge status: Home / Self Care | Payer: Medicare Other | Source: Ambulatory Visit | Attending: Gastroenterology | Admitting: Gastroenterology

## 2014-01-25 DIAGNOSIS — R1310 Dysphagia, unspecified: Secondary | ICD-10-CM | POA: Insufficient documentation

## 2014-01-25 DIAGNOSIS — R634 Abnormal weight loss: Secondary | ICD-10-CM | POA: Insufficient documentation

## 2014-01-25 DIAGNOSIS — R1319 Other dysphagia: Secondary | ICD-10-CM

## 2014-01-25 DIAGNOSIS — R1110 Vomiting, unspecified: Secondary | ICD-10-CM | POA: Insufficient documentation

## 2014-01-25 DIAGNOSIS — K224 Dyskinesia of esophagus: Secondary | ICD-10-CM | POA: Insufficient documentation

## 2014-02-18 ENCOUNTER — Non-Acute Institutional Stay (SKILLED_NURSING_FACILITY): Payer: PRIVATE HEALTH INSURANCE | Admitting: Internal Medicine

## 2014-02-18 DIAGNOSIS — J449 Chronic obstructive pulmonary disease, unspecified: Secondary | ICD-10-CM

## 2014-02-18 DIAGNOSIS — F22 Delusional disorders: Principal | ICD-10-CM

## 2014-02-18 DIAGNOSIS — I69959 Hemiplegia and hemiparesis following unspecified cerebrovascular disease affecting unspecified side: Secondary | ICD-10-CM

## 2014-02-18 DIAGNOSIS — F0152 Vascular dementia, unspecified severity, with psychotic disturbance: Secondary | ICD-10-CM

## 2014-02-18 DIAGNOSIS — F0151 Vascular dementia with behavioral disturbance: Secondary | ICD-10-CM
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
## 2014-02-28 ENCOUNTER — Ambulatory Visit: Payer: Medicare Other | Admitting: Internal Medicine

## 2014-03-06 ENCOUNTER — Non-Acute Institutional Stay (SKILLED_NURSING_FACILITY): Payer: PRIVATE HEALTH INSURANCE | Admitting: Internal Medicine

## 2014-03-06 DIAGNOSIS — F0152 Vascular dementia, unspecified severity, with psychotic disturbance: Secondary | ICD-10-CM

## 2014-03-06 DIAGNOSIS — I2510 Atherosclerotic heart disease of native coronary artery without angina pectoris: Secondary | ICD-10-CM

## 2014-03-06 DIAGNOSIS — F0151 Vascular dementia with behavioral disturbance: Secondary | ICD-10-CM

## 2014-03-06 DIAGNOSIS — F22 Delusional disorders: Principal | ICD-10-CM

## 2014-03-06 DIAGNOSIS — J449 Chronic obstructive pulmonary disease, unspecified: Secondary | ICD-10-CM
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
## 2014-03-22 ENCOUNTER — Ambulatory Visit (INDEPENDENT_AMBULATORY_CARE_PROVIDER_SITE_OTHER): Payer: Medicare Other | Admitting: Gastroenterology

## 2014-03-22 ENCOUNTER — Encounter: Payer: Self-pay | Admitting: Gastroenterology

## 2014-03-22 VITALS — BP 102/60 | HR 68 | Ht 67.0 in | Wt 171.5 lb

## 2014-03-22 DIAGNOSIS — R112 Nausea with vomiting, unspecified: Secondary | ICD-10-CM

## 2014-03-22 NOTE — Patient Instructions (Signed)
No plans for testing at this point. Try to refrain from him BBQ sauce, ketchup, gravy, too much salt.
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
## 2014-04-17 ENCOUNTER — Non-Acute Institutional Stay (SKILLED_NURSING_FACILITY): Payer: PRIVATE HEALTH INSURANCE | Admitting: Internal Medicine

## 2014-04-17 DIAGNOSIS — F0152 Vascular dementia, unspecified severity, with psychotic disturbance: Secondary | ICD-10-CM

## 2014-04-17 DIAGNOSIS — K219 Gastro-esophageal reflux disease without esophagitis: Secondary | ICD-10-CM

## 2014-04-17 DIAGNOSIS — F0151 Vascular dementia with behavioral disturbance: Secondary | ICD-10-CM

## 2014-04-17 DIAGNOSIS — J449 Chronic obstructive pulmonary disease, unspecified: Secondary | ICD-10-CM

## 2014-04-17 DIAGNOSIS — F22 Delusional disorders: Principal | ICD-10-CM
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
## 2014-05-24 ENCOUNTER — Telehealth: Payer: Self-pay | Admitting: Cardiology

## 2014-05-24 NOTE — Telephone Encounter (Signed)
error 

## 2014-06-21 ENCOUNTER — Non-Acute Institutional Stay (SKILLED_NURSING_FACILITY): Payer: PRIVATE HEALTH INSURANCE | Admitting: Internal Medicine

## 2014-06-21 DIAGNOSIS — F0151 Vascular dementia with behavioral disturbance: Secondary | ICD-10-CM

## 2014-06-21 DIAGNOSIS — I2510 Atherosclerotic heart disease of native coronary artery without angina pectoris: Secondary | ICD-10-CM

## 2014-06-21 DIAGNOSIS — F0152 Vascular dementia, unspecified severity, with psychotic disturbance: Secondary | ICD-10-CM

## 2014-06-21 DIAGNOSIS — J449 Chronic obstructive pulmonary disease, unspecified: Secondary | ICD-10-CM

## 2014-06-21 DIAGNOSIS — F22 Delusional disorders: Principal | ICD-10-CM
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
## 2014-07-25 ENCOUNTER — Encounter: Payer: Self-pay | Admitting: Cardiology

## 2014-08-02 ENCOUNTER — Non-Acute Institutional Stay (SKILLED_NURSING_FACILITY): Payer: PRIVATE HEALTH INSURANCE | Admitting: Internal Medicine

## 2014-08-02 DIAGNOSIS — I2510 Atherosclerotic heart disease of native coronary artery without angina pectoris: Secondary | ICD-10-CM

## 2014-08-02 DIAGNOSIS — F0152 Vascular dementia, unspecified severity, with psychotic disturbance: Secondary | ICD-10-CM

## 2014-08-02 DIAGNOSIS — K219 Gastro-esophageal reflux disease without esophagitis: Secondary | ICD-10-CM

## 2014-08-02 DIAGNOSIS — F0151 Vascular dementia with behavioral disturbance: Secondary | ICD-10-CM

## 2014-08-02 DIAGNOSIS — F22 Delusional disorders: Secondary | ICD-10-CM

## 2014-08-04 NOTE — Progress Notes (Addendum)
Patient ID: Erik [name], male   DOB: March 04, 1941, 73 y.o.   MRN: [id number]              PROGRESS NOTE  DATE:  08/02/2014       FACILITY: [facility]    LEVEL OF CARE:   SNF   Routine Visit   CHIEF COMPLAINT:  Follow up medical issues, [employer] visit.     HISTORY OF PRESENT ILLNESS:  This is a patient who has a history of vascular dementia.  He scores 15/30 on the Folstein mini-mental status.    He has a history of depression, also pseudobulbar affect.    He seems to have had a right CVA at the time of a CABG in 1999.  He follows with Cardiology here, although I am really unable to get from him that he has any exertional chest symptoms.    He also has a history of COPD.    PAST MEDICAL HISTORY/PROBLEM LIST:    Vascular dementia with depression.    Osteoarthritis of the right knee.    History of BPH.    Renal insufficiency, with last creatinine at 1.50 on 05/24/2014.    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    Coronary artery disease.    Esophageal reflux.    COPD.    PAST SURGICAL HISTORY:     Coronary angioplasty with stent placement in 1997.    Status post CABG in 1999.    Hernia repair.    Lung lobectomy in 2007.    Knee surgery.    Cholecystectomy.    CURRENT MEDICATIONS:   Medication list is reviewed.     Amlodipine 10 q.d.    Lasix 40 q.d.    Flomax 0.4 q.d.    Aricept 10 q.h.s.    Paxil 10 q.d.    Humibid LA 600, 1 tablet twice daily.    Voltaren gel to right knee twice daily.    Combivent inhaler 1 puff four times a day.    Nexium 40 q.d.    Depakote 250 three times a day.    Apresoline 50 four times a day.    Zanaflex 2 mg four times a day.    Ecotrin 81 q.d.    Lipitor 10 q.d.    REVIEW OF SYSTEMS:     CHEST/RESPIRATORY:  No complaints of shortness of breath or cough.   CARDIAC:   He does not describe exertional chest pain or palpitations.   GI:   No nausea, vomiting or diarrhea.    PHYSICAL EXAMINATION:   VITAL  SIGNS:   TEMPERATURE:   99.4.   PULSE:  82.   RESPIRATIONS:  18.   BLOOD PRESSURE:  Reasonably well controlled, 136/84.   WEIGHT:  170 pounds.   O2 SATURATIONS:  95% on room air.   CHEST/RESPIRATORY:  Clear air entry bilaterally.  He has a history of right lung resection.   CARDIOVASCULAR:  CARDIAC:   Heart sounds are normal.  He has a pansystolic murmur, but no signs of CHF.   GASTROINTESTINAL:  ABDOMEN:   Nondistended.    ASSESSMENT/PLAN:  Vascular dementia with depression.  Currently, this is very stable.    Coronary artery disease.  There is nothing based on bedside assessment that suggests this is active.    COPD.  This seems stable.    Gastroesophageal reflux.  He has seen GI, who recommended twice a day Nexium which seems to be maintaining his symptoms.

## 2014-09-27 ENCOUNTER — Non-Acute Institutional Stay (SKILLED_NURSING_FACILITY): Payer: Medicare Other | Admitting: Internal Medicine

## 2014-09-27 DIAGNOSIS — F0152 Vascular dementia, unspecified severity, with psychotic disturbance: Secondary | ICD-10-CM

## 2014-09-27 DIAGNOSIS — F22 Delusional disorders: Secondary | ICD-10-CM

## 2014-09-27 DIAGNOSIS — I2510 Atherosclerotic heart disease of native coronary artery without angina pectoris: Secondary | ICD-10-CM

## 2014-09-27 DIAGNOSIS — F0151 Vascular dementia with behavioral disturbance: Secondary | ICD-10-CM

## 2014-09-27 DIAGNOSIS — K219 Gastro-esophageal reflux disease without esophagitis: Secondary | ICD-10-CM
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
## 2014-11-15 ENCOUNTER — Non-Acute Institutional Stay (SKILLED_NURSING_FACILITY): Payer: Medicare Other | Admitting: Internal Medicine

## 2014-11-15 DIAGNOSIS — F22 Delusional disorders: Secondary | ICD-10-CM

## 2014-11-15 DIAGNOSIS — F0152 Vascular dementia, unspecified severity, with psychotic disturbance: Secondary | ICD-10-CM

## 2014-11-15 DIAGNOSIS — I2510 Atherosclerotic heart disease of native coronary artery without angina pectoris: Secondary | ICD-10-CM

## 2014-11-15 DIAGNOSIS — J449 Chronic obstructive pulmonary disease, unspecified: Secondary | ICD-10-CM | POA: Diagnosis not present

## 2014-11-15 DIAGNOSIS — F0151 Vascular dementia with behavioral disturbance: Secondary | ICD-10-CM | POA: Diagnosis not present
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
## 2014-12-08 ENCOUNTER — Telehealth: Payer: Self-pay

## 2014-12-08 NOTE — Telephone Encounter (Signed)
Not able to leave message about flu vaccine

## 2014-12-09 NOTE — Telephone Encounter (Signed)
Not able to leave message about flu vaccine

## 2015-01-17 ENCOUNTER — Non-Acute Institutional Stay (SKILLED_NURSING_FACILITY): Payer: Medicare Other | Admitting: Internal Medicine

## 2015-01-17 DIAGNOSIS — F0151 Vascular dementia with behavioral disturbance: Secondary | ICD-10-CM | POA: Diagnosis not present

## 2015-01-17 DIAGNOSIS — I2510 Atherosclerotic heart disease of native coronary artery without angina pectoris: Secondary | ICD-10-CM | POA: Diagnosis not present

## 2015-01-17 DIAGNOSIS — J449 Chronic obstructive pulmonary disease, unspecified: Secondary | ICD-10-CM | POA: Diagnosis not present

## 2015-01-17 DIAGNOSIS — F22 Delusional disorders: Secondary | ICD-10-CM

## 2015-01-17 DIAGNOSIS — F0152 Vascular dementia, unspecified severity, with psychotic disturbance: Secondary | ICD-10-CM
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
## 2015-02-21 ENCOUNTER — Non-Acute Institutional Stay (SKILLED_NURSING_FACILITY): Payer: Medicare Other | Admitting: Internal Medicine

## 2015-02-21 DIAGNOSIS — I2510 Atherosclerotic heart disease of native coronary artery without angina pectoris: Secondary | ICD-10-CM

## 2015-02-21 DIAGNOSIS — I340 Nonrheumatic mitral (valve) insufficiency: Secondary | ICD-10-CM

## 2015-02-21 DIAGNOSIS — F0151 Vascular dementia with behavioral disturbance: Secondary | ICD-10-CM

## 2015-02-21 DIAGNOSIS — F22 Delusional disorders: Secondary | ICD-10-CM | POA: Diagnosis not present

## 2015-02-21 DIAGNOSIS — F0152 Vascular dementia, unspecified severity, with psychotic disturbance: Secondary | ICD-10-CM
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
## 2015-04-15 ENCOUNTER — Non-Acute Institutional Stay (SKILLED_NURSING_FACILITY): Payer: Medicare Other | Admitting: Internal Medicine

## 2015-04-15 DIAGNOSIS — I2510 Atherosclerotic heart disease of native coronary artery without angina pectoris: Secondary | ICD-10-CM | POA: Diagnosis not present

## 2015-04-15 DIAGNOSIS — J449 Chronic obstructive pulmonary disease, unspecified: Secondary | ICD-10-CM
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
## 2016-01-22 ENCOUNTER — Non-Acute Institutional Stay (SKILLED_NURSING_FACILITY): Payer: Medicare Other | Admitting: Internal Medicine

## 2016-01-22 DIAGNOSIS — G8929 Other chronic pain: Secondary | ICD-10-CM | POA: Diagnosis not present

## 2016-01-22 DIAGNOSIS — M545 Low back pain: Secondary | ICD-10-CM | POA: Diagnosis not present

## 2016-01-22 DIAGNOSIS — I2510 Atherosclerotic heart disease of native coronary artery without angina pectoris: Secondary | ICD-10-CM

## 2016-01-22 DIAGNOSIS — F0152 Vascular dementia, unspecified severity, with psychotic disturbance: Secondary | ICD-10-CM

## 2016-01-22 DIAGNOSIS — F22 Delusional disorders: Secondary | ICD-10-CM

## 2016-01-22 DIAGNOSIS — F0151 Vascular dementia with behavioral disturbance: Secondary | ICD-10-CM

## 2016-01-22 DIAGNOSIS — I5032 Chronic diastolic (congestive) heart failure: Secondary | ICD-10-CM | POA: Diagnosis not present
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
## 2016-05-28 ENCOUNTER — Emergency Department (HOSPITAL_COMMUNITY): Payer: Medicare Other

## 2016-05-28 ENCOUNTER — Encounter (HOSPITAL_COMMUNITY): Payer: Self-pay | Admitting: Emergency Medicine

## 2016-05-28 ENCOUNTER — Emergency Department (HOSPITAL_COMMUNITY)
Admission: EM | Admit: 2016-05-28 | Discharge: 2016-05-28 | Disposition: A | Discharge status: Home / Self Care | Payer: Medicare Other | Attending: Physician Assistant | Admitting: Physician Assistant

## 2016-05-28 DIAGNOSIS — M549 Dorsalgia, unspecified: Secondary | ICD-10-CM | POA: Insufficient documentation

## 2016-05-28 DIAGNOSIS — Y999 Unspecified external cause status: Secondary | ICD-10-CM | POA: Insufficient documentation

## 2016-05-28 DIAGNOSIS — I2510 Atherosclerotic heart disease of native coronary artery without angina pectoris: Secondary | ICD-10-CM | POA: Insufficient documentation

## 2016-05-28 DIAGNOSIS — Z87891 Personal history of nicotine dependence: Secondary | ICD-10-CM | POA: Diagnosis not present

## 2016-05-28 DIAGNOSIS — Y92129 Unspecified place in nursing home as the place of occurrence of the external cause: Secondary | ICD-10-CM | POA: Insufficient documentation

## 2016-05-28 DIAGNOSIS — W050XXA Fall from non-moving wheelchair, initial encounter: Secondary | ICD-10-CM | POA: Diagnosis not present

## 2016-05-28 DIAGNOSIS — N183 Chronic kidney disease, stage 3 (moderate): Secondary | ICD-10-CM | POA: Diagnosis not present

## 2016-05-28 DIAGNOSIS — G309 Alzheimer's disease, unspecified: Secondary | ICD-10-CM | POA: Insufficient documentation

## 2016-05-28 DIAGNOSIS — I130 Hypertensive heart and chronic kidney disease with heart failure and stage 1 through stage 4 chronic kidney disease, or unspecified chronic kidney disease: Secondary | ICD-10-CM | POA: Insufficient documentation

## 2016-05-28 DIAGNOSIS — Z85118 Personal history of other malignant neoplasm of bronchus and lung: Secondary | ICD-10-CM | POA: Diagnosis not present

## 2016-05-28 DIAGNOSIS — Z79899 Other long term (current) drug therapy: Secondary | ICD-10-CM | POA: Diagnosis not present

## 2016-05-28 DIAGNOSIS — M25512 Pain in left shoulder: Secondary | ICD-10-CM | POA: Insufficient documentation

## 2016-05-28 DIAGNOSIS — Y939 Activity, unspecified: Secondary | ICD-10-CM | POA: Diagnosis not present

## 2016-05-28 DIAGNOSIS — I5032 Chronic diastolic (congestive) heart failure: Secondary | ICD-10-CM | POA: Diagnosis not present

## 2016-05-28 DIAGNOSIS — W19XXXA Unspecified fall, initial encounter: Secondary | ICD-10-CM

## 2016-05-28 DIAGNOSIS — Z7982 Long term (current) use of aspirin: Secondary | ICD-10-CM | POA: Diagnosis not present

## 2016-05-28 DIAGNOSIS — J449 Chronic obstructive pulmonary disease, unspecified: Secondary | ICD-10-CM | POA: Insufficient documentation

## 2016-05-28 DIAGNOSIS — R531 Weakness: Secondary | ICD-10-CM | POA: Diagnosis present

## 2016-05-28 DIAGNOSIS — R51 Headache: Secondary | ICD-10-CM | POA: Diagnosis not present

## 2016-05-28 LAB — CBC WITH DIFFERENTIAL/PLATELET
BASOS PCT: 0 %
Basophils Absolute: 0 10*3/uL (ref 0.0–0.1)
EOS ABS: 0.1 10*3/uL (ref 0.0–0.7)
Eosinophils Relative: 1 %
HEMATOCRIT: 42.7 % (ref 39.0–52.0)
Hemoglobin: 14.7 g/dL (ref 13.0–17.0)
Lymphocytes Relative: 14 %
Lymphs Abs: 1.9 10*3/uL (ref 0.7–4.0)
MCH: 29.3 pg (ref 26.0–34.0)
MCHC: 34.4 g/dL (ref 30.0–36.0)
MCV: 85.2 fL (ref 78.0–100.0)
MONO ABS: 1.5 10*3/uL — AB (ref 0.1–1.0)
MONOS PCT: 11 %
NEUTROS ABS: 9.8 10*3/uL — AB (ref 1.7–7.7)
Neutrophils Relative %: 74 %
Platelets: 266 10*3/uL (ref 150–400)
RBC: 5.01 MIL/uL (ref 4.22–5.81)
RDW: 15.2 % (ref 11.5–15.5)
WBC: 13.4 10*3/uL — ABNORMAL HIGH (ref 4.0–10.5)

## 2016-05-28 LAB — BASIC METABOLIC PANEL
Anion gap: 10 (ref 5–15)
BUN: 33 mg/dL — AB (ref 6–20)
CALCIUM: 9.1 mg/dL (ref 8.9–10.3)
CO2: 25 mmol/L (ref 22–32)
CREATININE: 1.42 mg/dL — AB (ref 0.61–1.24)
Chloride: 108 mmol/L (ref 101–111)
GFR calc non Af Amer: 46 mL/min — ABNORMAL LOW (ref >60)
GFR, EST AFRICAN AMERICAN: 53 mL/min — AB (ref >60)
Glucose, Bld: 116 mg/dL — ABNORMAL HIGH (ref 65–99)
Potassium: 4.1 mmol/L (ref 3.5–5.1)
Sodium: 143 mmol/L (ref 135–145)

## 2016-05-28 MED ORDER — CYCLOBENZAPRINE HCL 10 MG PO TABS
5.0000 mg | ORAL_TABLET | Freq: Once | ORAL | Status: AC
  Administered 2016-05-28: 5 mg via ORAL
  Filled 2016-05-28: qty 1

## 2016-05-28 MED ORDER — CYCLOBENZAPRINE HCL 10 MG PO TABS
5.0000 mg | ORAL_TABLET | Freq: Two times a day (BID) | ORAL | 0 refills | Status: AC | PRN
Start: 2016-05-28 — End: ?

## 2016-05-28 MED ORDER — FENTANYL CITRATE (PF) 100 MCG/2ML IJ SOLN: 50.0000 ug | Freq: Once | INTRAMUSCULAR | Status: DC

## 2016-05-28 NOTE — ED Notes (Signed)
Call placed to [facility]

## 2016-05-28 NOTE — ED Notes (Addendum)
Facility said patient hurt his shoulder but not sure which one.  Patiet is a poor historian, due to mental status

## 2016-05-28 NOTE — Progress Notes (Signed)
[facility] dementia  Pt in ED with unwitnessed fall,  CM confirmed with ED notes and ED RN that pt baseline is non ambulatory, getting around in w/c only

## 2016-05-28 NOTE — ED Notes (Signed)
Patient is a & o to his baseline.  Daughter at bedside.  She signed for discharge instructions.

## 2016-05-28 NOTE — ED Notes (Signed)
Call placed to to [facility].  ETA: 5:00 pm

## 2016-05-28 NOTE — ED Triage Notes (Signed)
Patient is from [facility].  Patient was going to pick up his leg brace and fell out of wheel chair.  Facility states patient hit his head.  Denies LOC.  Patient is alert & orientated to baseline.    BP:140/70 HR:70 regular R;18   Patient is not a diabetic.  No CBG.  Patient is complaining of left shoulder pain.

## 2016-05-28 NOTE — Discharge Instructions (Signed)
Continue taking her medications as prescribed. I recommend fine up with her primary care provider within the next week. Please return to the Emergency Department if symptoms worsen or new onset of fever, headache, visual changes, change in behavior, confusion, change in mental status, weakness, chest pain, difficulty breathing, abdominal pain, vomiting, decreased oral intake.
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
## 2016-05-28 NOTE — ED Notes (Signed)
Bed: WA17 Expected date:  Expected time:  Means of arrival:  Comments: EMS - 75 yo unwitnessed fall, no anticoagulants

## 2016-05-28 NOTE — ED Notes (Signed)
Bed: [facility] Expected date:  Expected time:  Means of arrival:  Comments: No Bed

## 2016-05-28 NOTE — ED Notes (Signed)
Call was placed to facility and spoke to [place].

## 2017-06-24 ENCOUNTER — Encounter: Payer: Self-pay | Admitting: Gastroenterology

## 2017-07-24 DEATH — deceased

## 2017-09-29 IMAGING — CR DG SHOULDER 2+V*L*
2 series · 2 of 2 positions shown · non-contrast
Comparison: No recent prior .

CLINICAL DATA: Fall.

EXAM:
LEFT SHOULDER - 2+ VIEW

[t shoulder y-view left]
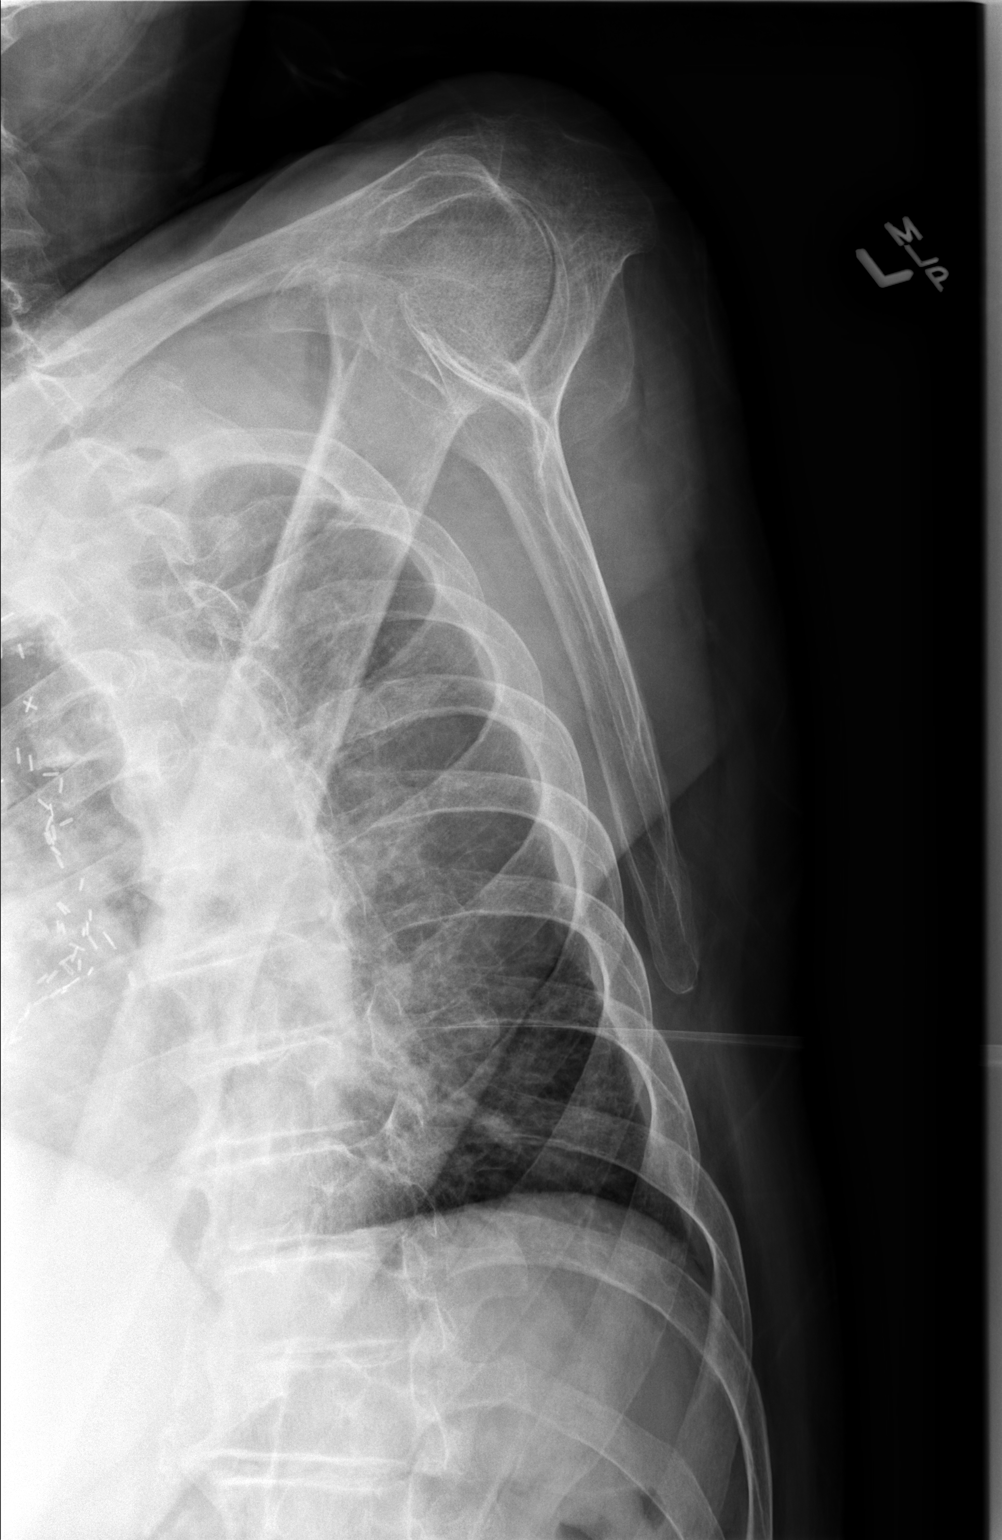

[t shoulder external left]
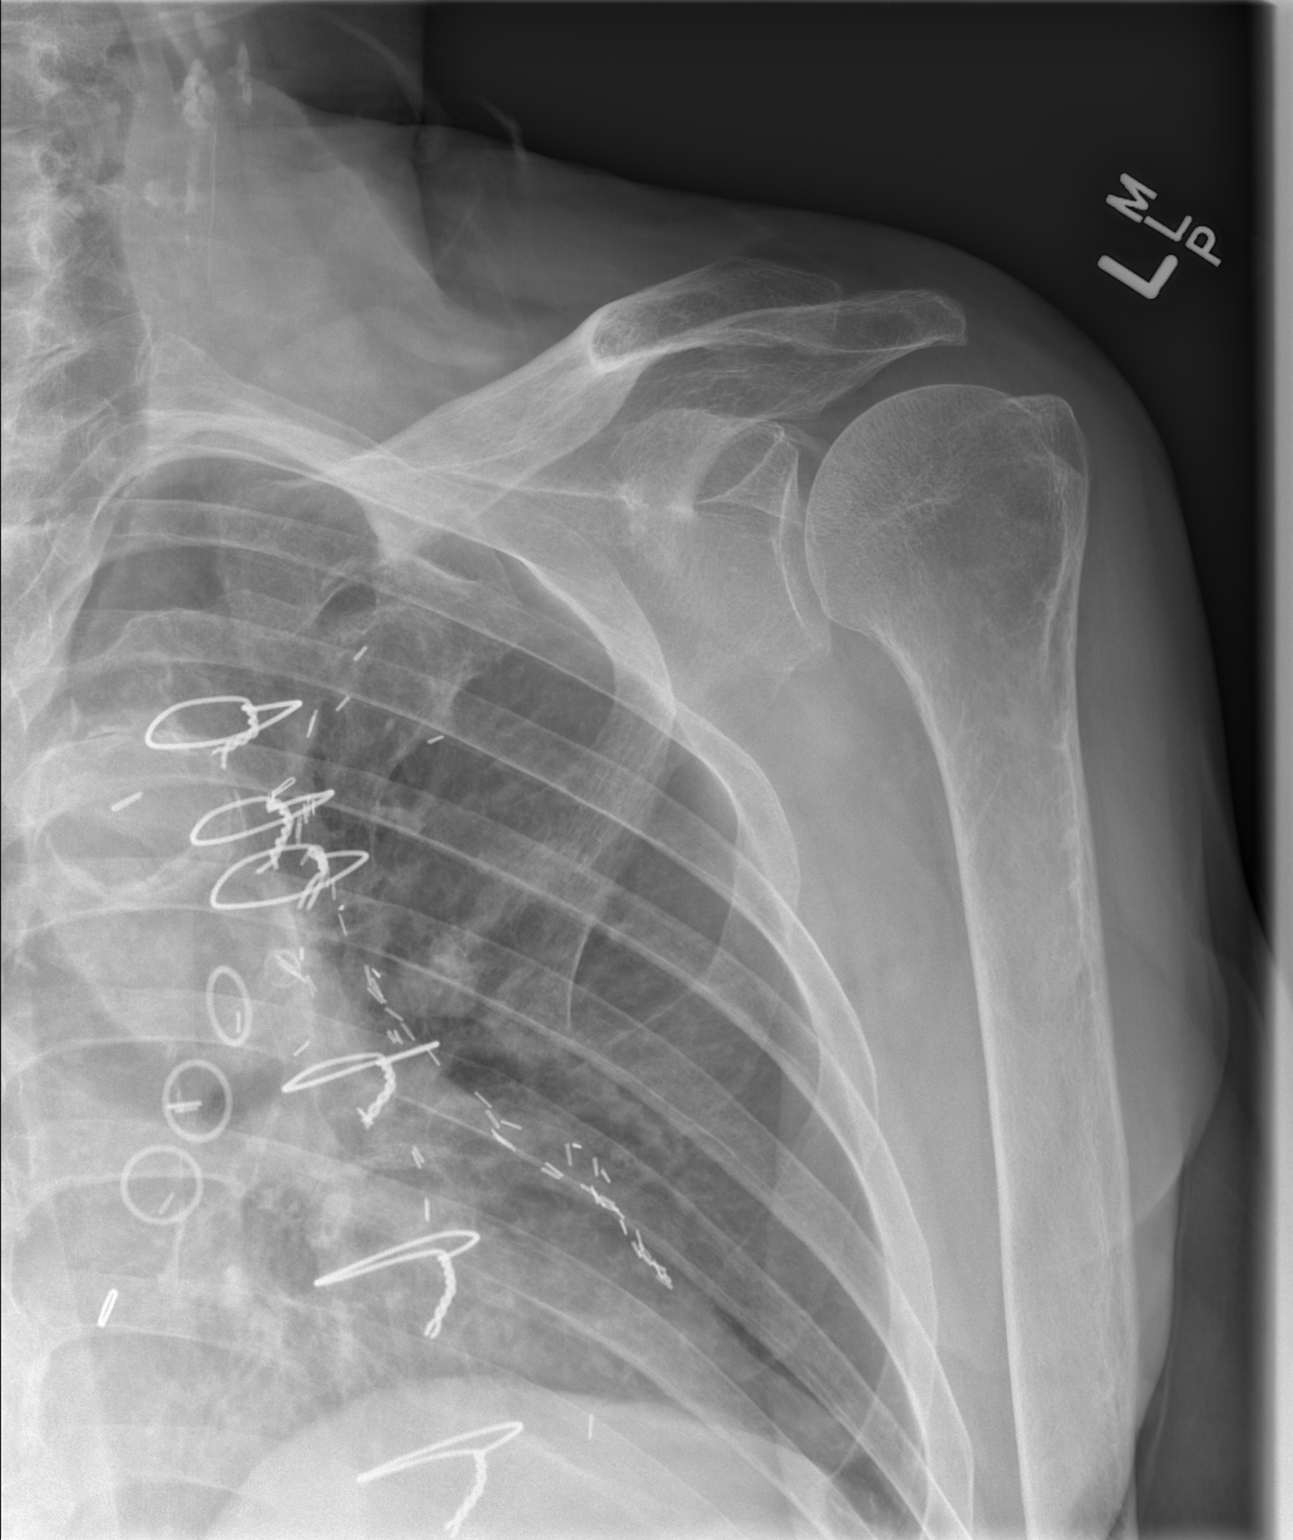

[2 of 2 positions shown; findings below may reference images not displayed]

FINDINGS: Tricompartment degenerative change. No evidence of fracture or
dislocation. No acute bony abnormality identified. Prior CABG.
Carotid vascular calcification.
IMPRESSION: 1. Degenerative changes left shoulder. No acute bony or joint
abnormality identified.

2. Prior CABG.  Carotid vascular disease.

## 2017-09-29 IMAGING — CT CT CERVICAL SPINE W/O CM
3 of 12 series · 7 of 33 positions shown, 8 images · non-contrast
Comparison: 10/08/2012, 10/23/2009

CLINICAL DATA: Fall from wheelchair with headaches

EXAM:
CT HEAD WITHOUT CONTRAST
CT CERVICAL SPINE WITHOUT CONTRAST
TECHNIQUE: Multidetector CT imaging of the head and cervical spine was
performed following the standard protocol without intravenous
contrast. Multiplanar CT image reconstructions of the cervical spine
were also generated.

[Series 6: c-spine st · axial · 0.29mm/px · z∈[-251,-199]mm · 2 of 80 slices shown, 3 images]
[im 27/80  soft-tissue]
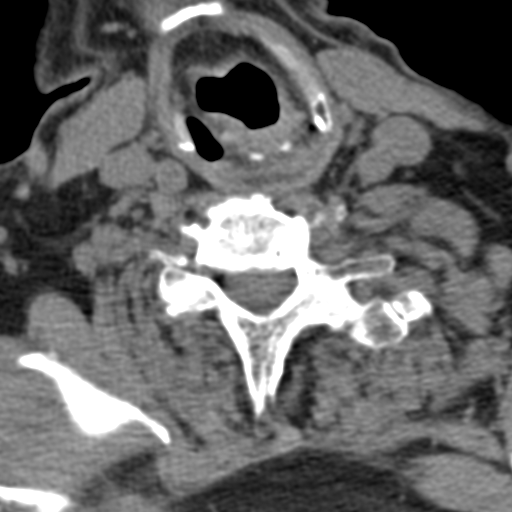
[im 27/80  bone]
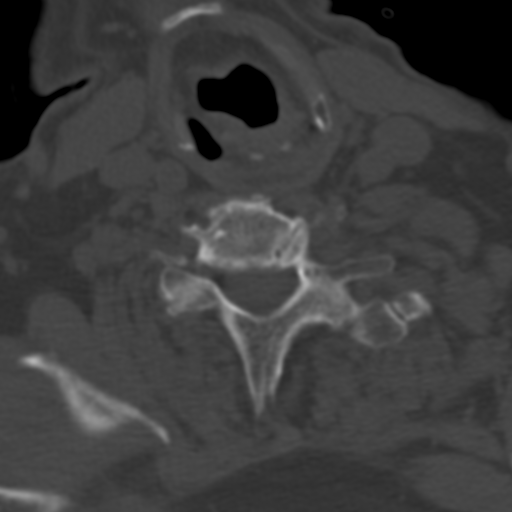
[im 53/80  bone]
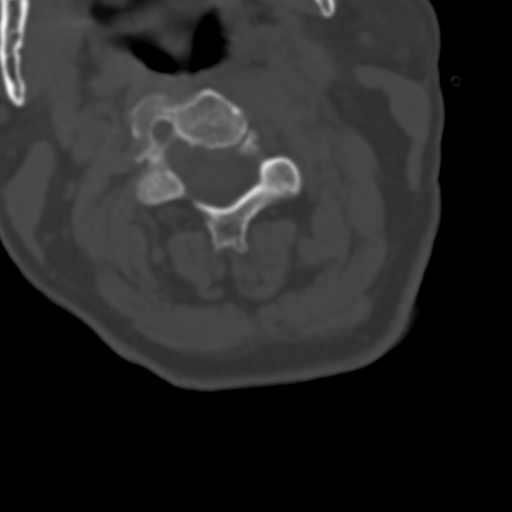

[Series 11: axial reformats · axial · 0.23mm/px · z∈[-274,-229]mm · 2 of 80 slices shown]
[im 27/80  bone]
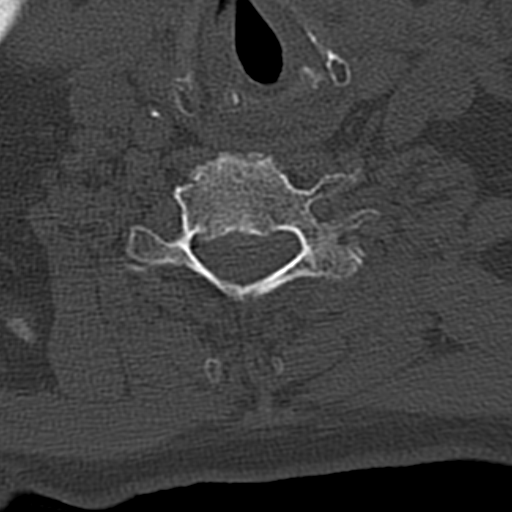
[im 53/80  bone]
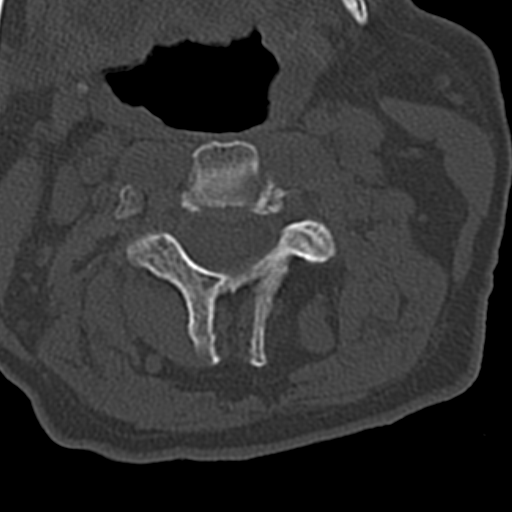

[Series 603: <mpr thick range(1)> · sagittal · 0.41mm/px · 3 of 54 slices shown]
[im 14/54  bone]
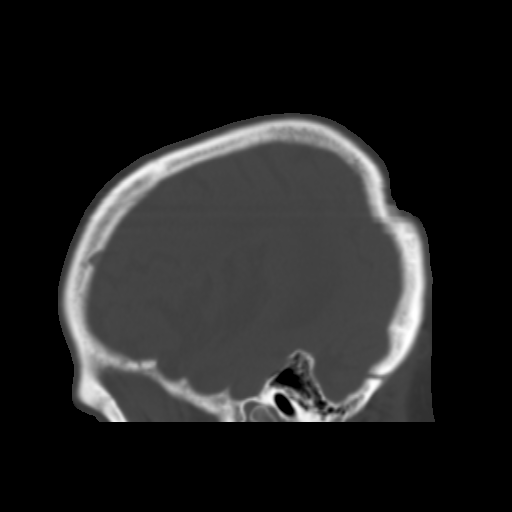
[im 27/54  bone]
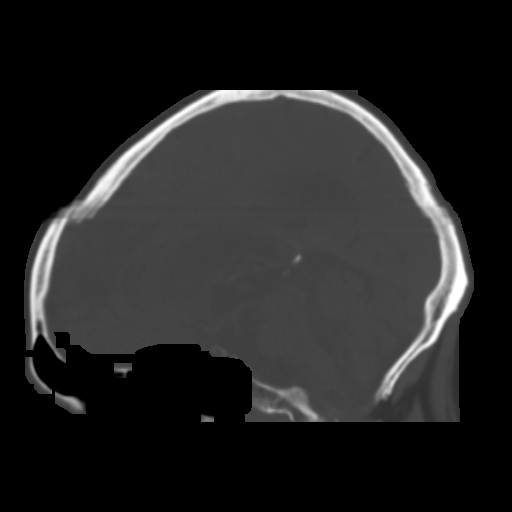
[im 40/54  bone]
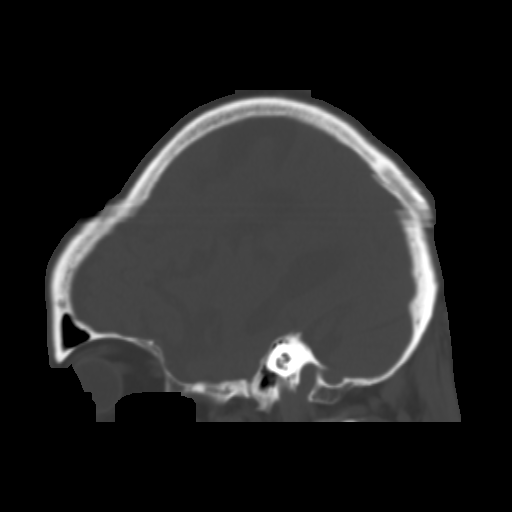

[7 of 33 positions shown; findings below may reference images not displayed]

FINDINGS: CT HEAD FINDINGS

The bony calvarium is intact. Diffuse atrophic changes and chronic
white matter ischemic changes are seen. Compensatory dilatation of
the ventricles is noted. No findings to suggest acute hemorrhage,
acute infarction or space-occupying mass lesion are noted.

CT CERVICAL SPINE FINDINGS

Seven cervical segments are well visualized. No acute fracture or
acute facet abnormality is noted. Mild osteophytic changes are noted
from C4-C7. Mild loss of the normal cervical lordosis is noted of
uncertain significance. This may be related to muscular spasm. Mild
facet hypertrophic changes are seen at multiple levels. Visualized
lung apices show emphysematous changes. No gross soft tissue
abnormality is seen. Carotid bifurcation calcifications are noted.
Stable sclerotic focus is noted at C2.
IMPRESSION: CT of the head: Chronic atrophic and chronic white matter ischemic
changes without acute abnormality.

CT of the cervical spine: Multilevel degenerative change without
acute abnormality.

## 2019-01-22 DEATH — deceased
